# Patient Record
Sex: Female | Born: 1995 | Hispanic: No | Marital: Single | Smoking: Former smoker
Health system: Southern US, Community
[De-identification: ages and names within clinical notes are randomized; demographics above are authoritative.]

## PROBLEM LIST (undated history)

## (undated) DIAGNOSIS — F32A Depression, unspecified: Secondary | ICD-10-CM

## (undated) DIAGNOSIS — F319 Bipolar disorder, unspecified: Secondary | ICD-10-CM

## (undated) DIAGNOSIS — Z8619 Personal history of other infectious and parasitic diseases: Secondary | ICD-10-CM

## (undated) DIAGNOSIS — F419 Anxiety disorder, unspecified: Secondary | ICD-10-CM

## (undated) DIAGNOSIS — R87629 Unspecified abnormal cytological findings in specimens from vagina: Secondary | ICD-10-CM

## (undated) HISTORY — DX: Depression, unspecified: F32.A

## (undated) HISTORY — DX: Bipolar disorder, unspecified: F31.9

## (undated) HISTORY — PX: NO PAST SURGERIES: SHX2092

## (undated) HISTORY — DX: Unspecified abnormal cytological findings in specimens from vagina: R87.629

## (undated) HISTORY — DX: Anxiety disorder, unspecified: F41.9

---

## 2015-01-25 DIAGNOSIS — N883 Incompetence of cervix uteri: Secondary | ICD-10-CM

## 2020-07-17 NOTE — L&D Delivery Note (Signed)
OB/GYN Faculty Practice Delivery Note  Elizabeth [name] is a 25 y.o. [id number] s/p SVD at [redacted]w[redacted]d. She was admitted for SROM and early labor.   ROM: 5h 57m with clear fluid GBS Status: Negative/-- (07/12 1109) Maximum Maternal Temperature: 98.4  Labor Progress: Initial SVE: 3.5/50. She then progressed to complete without augmentation.   Delivery Date/Time: 02/16/2021, 4709  Delivery: Called to room and patient was complete and pushing. Head delivered middle OA. No nuchal cord present. Shoulder and body delivered in usual fashion. Infant with spontaneous cry, placed on mother's abdomen, dried and stimulated. Cord clamped x 2 after 1-minute delay, and cut by father. Cord blood drawn. Placenta delivered spontaneously with gentle cord traction. Fundus firm with massage and Pitocin. Labia, perineum, vagina, and cervix inspected inspected with a peri-urethral and 1 st degree perineal, repaired with 4.0 and 3.0 vicryl respectively.   Baby Weight: pending  Placenta: Sent to L&D Complications: None Lacerations: right periurethral and 1st degree  EBL: 28 mL Analgesia: Epidural  Infant:  APGAR (1 MIN):  9 APGAR (5 MINS):  9 APGAR (10 MINS):     [name], DO  OB Family Medicine Fellow, [facility] for [facility], [facility] 02/16/2021, 9:43 AM

## 2020-07-26 LAB — OB RESULTS CONSOLE HGB/HCT, BLOOD
HCT: 42 — AB (ref 29–41)
Hemoglobin: 14

## 2020-07-26 LAB — HIV ANTIBODY (ROUTINE TESTING W REFLEX): HIV Screen 4th Generation wRfx: NONREACTIVE

## 2020-07-26 LAB — HEPATITIS C ANTIBODY
HCV Ab: NEGATIVE
HCV Ab: NEGATIVE

## 2020-07-26 LAB — OB RESULTS CONSOLE RPR: RPR: NONREACTIVE

## 2020-07-26 LAB — OB RESULTS CONSOLE GC/CHLAMYDIA
Chlamydia: NEGATIVE
Gonorrhea: NEGATIVE

## 2020-07-26 LAB — OB RESULTS CONSOLE HIV ANTIBODY (ROUTINE TESTING): HIV: NONREACTIVE

## 2020-07-26 LAB — OB RESULTS CONSOLE ABO/RH: RH Type: POSITIVE

## 2020-07-26 LAB — OB RESULTS CONSOLE HEPATITIS B SURFACE ANTIGEN: Hepatitis B Surface Ag: NEGATIVE

## 2020-07-26 LAB — OB RESULTS CONSOLE RUBELLA ANTIBODY, IGM: Rubella: IMMUNE

## 2020-07-26 LAB — OB RESULTS CONSOLE PLATELET COUNT: Platelets: 221

## 2020-10-05 ENCOUNTER — Encounter (HOSPITAL_COMMUNITY): Payer: Self-pay | Admitting: Obstetrics & Gynecology

## 2020-10-05 ENCOUNTER — Inpatient Hospital Stay (HOSPITAL_COMMUNITY)
Admission: AD | Admit: 2020-10-05 | Discharge: 2020-10-05 | Disposition: A | Discharge status: Home / Self Care | Payer: Self-pay | Attending: Obstetrics & Gynecology | Admitting: Obstetrics & Gynecology

## 2020-10-05 ENCOUNTER — Other Ambulatory Visit: Payer: Self-pay

## 2020-10-05 DIAGNOSIS — B9689 Other specified bacterial agents as the cause of diseases classified elsewhere: Secondary | ICD-10-CM | POA: Insufficient documentation

## 2020-10-05 DIAGNOSIS — O26899 Other specified pregnancy related conditions, unspecified trimester: Secondary | ICD-10-CM

## 2020-10-05 DIAGNOSIS — R102 Pelvic and perineal pain: Secondary | ICD-10-CM | POA: Insufficient documentation

## 2020-10-05 DIAGNOSIS — R1030 Lower abdominal pain, unspecified: Secondary | ICD-10-CM | POA: Insufficient documentation

## 2020-10-05 DIAGNOSIS — Z3A20 20 weeks gestation of pregnancy: Secondary | ICD-10-CM | POA: Insufficient documentation

## 2020-10-05 DIAGNOSIS — O23592 Infection of other part of genital tract in pregnancy, second trimester: Secondary | ICD-10-CM | POA: Insufficient documentation

## 2020-10-05 DIAGNOSIS — O99332 Smoking (tobacco) complicating pregnancy, second trimester: Secondary | ICD-10-CM | POA: Insufficient documentation

## 2020-10-05 DIAGNOSIS — N760 Acute vaginitis: Secondary | ICD-10-CM

## 2020-10-05 DIAGNOSIS — Z87410 Personal history of cervical dysplasia: Secondary | ICD-10-CM | POA: Insufficient documentation

## 2020-10-05 DIAGNOSIS — O26892 Other specified pregnancy related conditions, second trimester: Secondary | ICD-10-CM | POA: Insufficient documentation

## 2020-10-05 DIAGNOSIS — Z79890 Hormone replacement therapy (postmenopausal): Secondary | ICD-10-CM | POA: Insufficient documentation

## 2020-10-05 DIAGNOSIS — F17210 Nicotine dependence, cigarettes, uncomplicated: Secondary | ICD-10-CM | POA: Insufficient documentation

## 2020-10-05 LAB — URINALYSIS, ROUTINE W REFLEX MICROSCOPIC
Bilirubin Urine: NEGATIVE
Glucose, UA: NEGATIVE mg/dL
Hgb urine dipstick: NEGATIVE
Ketones, ur: NEGATIVE mg/dL
Leukocytes,Ua: NEGATIVE
Nitrite: NEGATIVE
Protein, ur: NEGATIVE mg/dL
Specific Gravity, Urine: 1.013 (ref 1.005–1.030)
pH: 9 — ABNORMAL HIGH (ref 5.0–8.0)

## 2020-10-05 LAB — WET PREP, GENITAL
Sperm: NONE SEEN
Trich, Wet Prep: NONE SEEN
Yeast Wet Prep HPF POC: NONE SEEN

## 2020-10-05 MED ORDER — METRONIDAZOLE 500 MG PO TABS: 500.0000 mg | ORAL_TABLET | Freq: Two times a day (BID) | ORAL | 0 refills | Status: DC

## 2020-10-05 NOTE — MAU Provider Note (Signed)
History     CSN: [id number]  Arrival date and time: 10/05/20 1815   Event Date/Time   First Provider Initiated Contact with Patient 10/05/20 1959      Chief Complaint  Patient presents with  . Vaginal Discharge  . Vaginal Pain   Elizabeth [name] is a 25 y.o. [id number] at [redacted]w[redacted]d by early [place] who receives care in [place].  She presents today for Vaginal Discharge and Vaginal Pain.  She states she has been having "a lot of discharge that is watery and clear."  She states the discharge has been ongoing for about 3 weeks.  She reports she was initially on progesterone and was told to discontinue usage to monitor the discharge. However, since discontinuing the discharge has continued. She denies itching or odor.  She endorses a history of CT and GC, but denies infection during pregnancy.   She also reports "pain and pressure" in the vaginal area.  She states the pain is intermittent and is "a throbbing."  She states it ranges from 8-9/10 and can the "be off."  She states the pain has been present for about one week.  She states the pain started after driving up from [place].    She reports that she "high risk" and was told to come to the ED until she can get her [place] insurance. She denies perception of fetal movement yet. 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  She denies issues during this pregnancy and states she was placed on progesterone for "preventative reasons."    OB History    Gravida  6   Para  1   Term  1   Preterm      AB  4   Living  1     SAB  3   IAB  1   Ectopic      Multiple      Live Births              No past medical history on file.  History reviewed. No pertinent surgical history.  History reviewed. No pertinent family history.  Social History   Tobacco Use  . Smoking status: Current Some Day Smoker    Packs/day: 0.25  . Smokeless tobacco: Never Used  Substance Use Topics   . Alcohol use: Not Currently  . Drug use: Not Currently    Allergies: Not on File  Medications Prior to Admission  Medication Sig Dispense Refill Last Dose  . progesterone 200 MG SUPP Place 200 mg vaginally at bedtime.   09/05/2020 at Unknown time    Review of Systems  Constitutional: Negative for chills and fever.  Respiratory: Negative for cough and shortness of breath.   Gastrointestinal: Positive for abdominal pain. Negative for constipation, diarrhea, nausea and vomiting.  Genitourinary: Positive for vaginal discharge. Negative for difficulty urinating, dysuria and vaginal bleeding.  Musculoskeletal: Positive for back pain (Intermittent-None currently, usually after work prior to laying down.).  Neurological: Negative for dizziness, light-headedness and headaches.   Physical Exam   Blood pressure (!) 106/51, pulse 71, temperature 98.3 F (36.8 C), temperature source Oral, resp. rate 16, height 5' 3.5" (1.613 m), weight 52.1 kg, SpO2 100 %.  Physical Exam Constitutional:      Appearance: Normal appearance.  HENT:     Head: Normocephalic and atraumatic.  Eyes:     Conjunctiva/sclera: Conjunctivae normal.  Cardiovascular:     Rate and Rhythm: Normal rate and regular rhythm.  Heart sounds: Normal heart sounds.  Pulmonary:     Effort: Pulmonary effort is normal. No respiratory distress.     Breath sounds: Normal breath sounds.  Abdominal:     General: Bowel sounds are normal.     Tenderness: There is no abdominal tenderness.     Comments: Fundus at umbilicus  Genitourinary:    Comments: Speculum Exam: -Normal External Genitalia: Non tender, no apparent discharge at introitus.  -Vaginal Vault: Pink mucosa with good rugae. Small amt thin white frothy discharge -wet prep collected -Cervix:Pink, no lesions, cysts, or polyps.  Appears closed. No active bleeding from os-GC/CT collected -Bimanual Exam: Dilation: Closed Effacement (%): 50 Exam by:: Sabas Sous,  CNM  Musculoskeletal:        General: Normal range of motion.     Cervical back: Normal range of motion.  Skin:    General: Skin is warm and dry.  Neurological:     Mental Status: She is alert.  Psychiatric:        Mood and Affect: Mood normal.        Behavior: Behavior normal.        Thought Content: Thought content normal.     MAU Course  Procedures Results for orders placed or performed during the hospital encounter of 10/05/20 (from the past 24 hour(s))  Urinalysis, Routine w reflex microscopic Urine, Clean Catch     Status: Abnormal   Collection Time: 10/05/20  7:00 PM  Result Value Ref Range   Color, Urine YELLOW YELLOW   APPearance HAZY (A) CLEAR   Specific Gravity, Urine 1.013 1.005 - 1.030   pH 9.0 (H) 5.0 - 8.0   Glucose, UA NEGATIVE NEGATIVE mg/dL   Hgb urine dipstick NEGATIVE NEGATIVE   Bilirubin Urine NEGATIVE NEGATIVE   Ketones, ur NEGATIVE NEGATIVE mg/dL   Protein, ur NEGATIVE NEGATIVE mg/dL   Nitrite NEGATIVE NEGATIVE   Leukocytes,Ua NEGATIVE NEGATIVE  Wet prep, genital     Status: Abnormal   Collection Time: 10/05/20  8:08 PM  Result Value Ref Range   Yeast Wet Prep HPF POC NONE SEEN NONE SEEN   Trich, Wet Prep NONE SEEN NONE SEEN   Clue Cells Wet Prep HPF POC PRESENT (A) NONE SEEN   WBC, Wet Prep HPF POC MANY (A) NONE SEEN   Sperm NONE SEEN     MDM Physical Exam Labs: Wet Prep, GC/CT Assessment and Plan  25 year old, [id number]  SIUP at 20.1 weeks Vaginal Discharge  -Reviewed POC with patient. -Exam performed and findings discussed.  -Cultures collected and pending.  -Discussed starting care at [facility] provider in [facility] -Informational sheet given. -Patient states she hopes/plans to start care in [facility]. -Encouraged to buy and utilize a maternity belt/band. -Will await results.   Cherre Robins 10/05/2020, 7:59 PM   Reassessment (9:12 PM)  -Results return significant for clue cells. -Provider to bedside and patient educated on findings  and treatment. -Patient given paper script for Metronidazole and encouraged to use good rx to try to obtain script. -Informed that GC/CT pending and if positive hospital will contact her for information on treatment. -Encouraged to call or return to MAU if symptoms worsen or with the onset of new symptoms. -Discharged to home in stable condition.  Cherre Robins MSN, CNM Advanced Practice Provider, Center for [employer]

## 2020-10-05 NOTE — Discharge Instructions (Signed)
  [place] [facility] Providers          [facility]  [address], [place], [place] [address]  [phone]  [facility]  [address], [place], [place] [address]  [phone]  [facility]  [address], [place], [place] [address]  [phone]  [facility] [place] [facility]  [address], [place], [place] [address]  [phone]  [facility]  [address]First floor), [place], [place] [address]  [phone]  [facility] [address], [place], [place] [address] [phone]  [facility]  [address], [place], [place] [address]  [phone]  [facility]  [address], [place], [place] [address]  [phone]  [facility]  [address], [place], [place] [address]  [phone]  [facility]  [address], [place], [place] [address]  [phone]  [facility]  [address], [place], [place] [address]  [phone]  [facility]  [address], [place], [place] [address]  [phone]  [facility]   [address], [place], [place] [address]  [phone]  [facility]  [address], [place], [place] [address]   [phone]  [facility]  [address], ,  [address]  [phone]         

## 2020-10-05 NOTE — MAU Note (Signed)
Is high risk, coming from [place]. Her dr in [place] told her to go to the emergency rm: she has been "discharging a lot and having pain and pressure in vaginal area".  Last seen end of Feb. Hx of SAB x3, short cx.  Is on progesterone.

## 2020-10-06 LAB — GC/CHLAMYDIA PROBE AMP (~~LOC~~) NOT AT ARMC
Chlamydia: NEGATIVE
Comment: NEGATIVE
Comment: NORMAL
Neisseria Gonorrhea: NEGATIVE

## 2020-11-13 ENCOUNTER — Other Ambulatory Visit: Payer: Self-pay

## 2020-11-13 ENCOUNTER — Encounter (HOSPITAL_BASED_OUTPATIENT_CLINIC_OR_DEPARTMENT_OTHER): Payer: Self-pay | Admitting: Emergency Medicine

## 2020-11-13 ENCOUNTER — Emergency Department (HOSPITAL_BASED_OUTPATIENT_CLINIC_OR_DEPARTMENT_OTHER)
Admission: EM | Admit: 2020-11-13 | Discharge: 2020-11-13 | Disposition: A | Discharge status: Home / Self Care | Payer: Medicaid Other | Attending: Emergency Medicine | Admitting: Emergency Medicine

## 2020-11-13 DIAGNOSIS — O99332 Smoking (tobacco) complicating pregnancy, second trimester: Secondary | ICD-10-CM | POA: Diagnosis not present

## 2020-11-13 DIAGNOSIS — O26852 Spotting complicating pregnancy, second trimester: Secondary | ICD-10-CM | POA: Insufficient documentation

## 2020-11-13 DIAGNOSIS — Z3A26 26 weeks gestation of pregnancy: Secondary | ICD-10-CM | POA: Diagnosis not present

## 2020-11-13 DIAGNOSIS — F17210 Nicotine dependence, cigarettes, uncomplicated: Secondary | ICD-10-CM | POA: Insufficient documentation

## 2020-11-13 DIAGNOSIS — R109 Unspecified abdominal pain: Secondary | ICD-10-CM

## 2020-11-13 DIAGNOSIS — R1084 Generalized abdominal pain: Secondary | ICD-10-CM | POA: Diagnosis not present

## 2020-11-13 NOTE — ED Notes (Signed)
Provider at the bedside.  

## 2020-11-13 NOTE — ED Triage Notes (Signed)
Pt reports she has generalized abdominal pain and pelvic pressure today. States she noticed she was spotting some this morning when she woke up. Pt is [redacted] weeks pregnant. 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

## 2020-11-13 NOTE — Discharge Instructions (Addendum)
[facility] [address] [place],  [place]  [address]  Phone number: [phone]  

## 2020-11-13 NOTE — Progress Notes (Signed)
Received call from [facility] RN. Pt is a G5P1 at [redacted] weeks gestation presenting with abd pain and pelvic pressure. She also reports spotting. No active bleeding or leaking of fluid. Pt has had three miscarriages and delivered her first baby at 36 weeks. She reports having taken progesterone during her pregnancy. She has recently moved here from [place].

## 2020-11-13 NOTE — ED Notes (Signed)
Return call received from rapid OB.  Reports Dr Eugenie Filler reviewed monitor strips and didn't see any issues with the baby or contractions but would be glad to monitor more closely at maternity admissions if needed.  Dr Elroy Channel can be reached at [phone].

## 2020-11-13 NOTE — ED Provider Notes (Signed)
[facility] EMERGENCY DEPARTMENT Provider Note   CSN: [id number] Arrival date & time: 11/13/20  1347     History Chief Complaint  Patient presents with  . Abdominal Pain    26 wk preg    Elizabeth [name] is a 25 y.o. female.   Abdominal Pain Pain location:  Generalized Pain quality: cramping   Pain radiates to:  Does not radiate Pain severity:  Moderate Onset quality:  Gradual Duration:  1 day Timing:  Intermittent Progression:  Waxing and waning Chronicity:  New Context comment:  26 w preg Relieved by:  Nothing Worsened by:  Nothing Ineffective treatments:  None tried Associated symptoms: no chest pain, no chills, no constipation, no cough, no diarrhea, no dysuria, no fever, no nausea, no shortness of breath and no vomiting   Risk factors: pregnancy        History reviewed. No pertinent past medical history.  There are no problems to display for this patient.   History reviewed. No pertinent surgical history.   OB History    Gravida  6   Para  1   Term  1   Preterm      AB  4   Living  1     SAB  3   IAB  1   Ectopic      Multiple      Live Births              No family history on file.  Social History   Tobacco Use  . Smoking status: Current Some Day Smoker    Packs/day: 0.25  . Smokeless tobacco: Never Used  Substance Use Topics  . Alcohol use: Not Currently  . Drug use: Not Currently    Home Medications Prior to Admission medications   Medication Sig Start Date End Date Taking? Authorizing Provider  metroNIDAZOLE (FLAGYL) 500 MG tablet Take 1 tablet (500 mg total) by mouth 2 (two) times daily. 10/05/20   Gerrit Heck, CNM  progesterone 200 MG SUPP Place 200 mg vaginally at bedtime.    [provider]    Allergies    Patient has no known allergies.  Review of Systems   Review of Systems  Constitutional: Negative for chills and fever.  HENT: Negative for congestion and rhinorrhea.   Respiratory:  Negative for cough and shortness of breath.   Cardiovascular: Negative for chest pain and palpitations.  Gastrointestinal: Positive for abdominal pain. Negative for constipation, diarrhea, nausea and vomiting.  Genitourinary: Negative for difficulty urinating and dysuria.  Musculoskeletal: Negative for arthralgias and back pain.  Skin: Negative for rash and wound.  Neurological: Negative for light-headedness and headaches.    Physical Exam Updated Vital Signs BP 107/62   Pulse 72   Temp 99 F (37.2 C) (Oral)   Resp 20   Ht 5' 3.5" (1.613 m)   Wt 49 kg   SpO2 100%   BMI 18.83 kg/m   Physical Exam Vitals and nursing note reviewed. Exam conducted with a chaperone present.  Constitutional:      General: She is not in acute distress.    Appearance: Normal appearance.  HENT:     Head: Normocephalic and atraumatic.     Nose: No rhinorrhea.  Eyes:     General:        Right eye: No discharge.        Left eye: No discharge.     Conjunctiva/sclera: Conjunctivae normal.  Cardiovascular:     Rate and  Rhythm: Normal rate and regular rhythm.  Pulmonary:     Effort: Pulmonary effort is normal. No respiratory distress.     Breath sounds: No stridor.  Abdominal:     General: Abdomen is flat. There is no distension.     Palpations: Abdomen is soft.     Tenderness: There is no abdominal tenderness. There is no guarding or rebound.     Comments: Gravid uterus, fundus just above the umbilicus  Musculoskeletal:        General: No tenderness or signs of injury.  Skin:    General: Skin is warm and dry.  Neurological:     General: No focal deficit present.     Mental Status: She is alert. Mental status is at baseline.     Motor: No weakness.  Psychiatric:        Mood and Affect: Mood normal.        Behavior: Behavior normal.     ED Results / Procedures / Treatments   Labs (all labs ordered are listed, but only abnormal results are displayed) Labs Reviewed - No data to  display  EKG None  Radiology No results found.  Procedures Procedures   Medications Ordered in ED Medications - No data to display  ED Course  I have reviewed the triage vital signs and the nursing notes.  Pertinent labs & imaging results that were available during my care of the patient were reviewed by me and considered in my medical decision making (see chart for details).    MDM Rules/Calculators/A&P                          [redacted] weeks pregnant.  Good prenatal care thus far.  History of 3 spontaneous abortions, secondary to cervical insufficiency.  She is worried that could be happening.  She was having pain and some light spotting.  She has been here with fetal heart rate with a good range of the 140s.  No contractions on tocometry.  OB/GYN is reviewed the strips and agrees.  Things do not look like any signs of precipitous delivery or concerns.  Family is comfortable with outpatient follow-up.  No further work-up is needed as she has no tenderness no complaints at this time.  She is given return precautions outpatient follow-up discussed Final Clinical Impression(s) / ED Diagnoses Final diagnoses:  Abdominal pain, unspecified abdominal location    Rx / DC Orders ED Discharge Orders    None       Sabino Donovan, MD 11/13/20 [phone]

## 2020-11-13 NOTE — Progress Notes (Signed)
Pt is 25 5/[redacted] weeks pregnant and is a G6P1.

## 2020-11-13 NOTE — Progress Notes (Signed)
Spoke with Dr. Alysia Penna. Pt is a G6P1 at 25 5/[redacted] weeks gestation complaining of abd pain and spotting last night. Her RN reports that the pt is not spotting now. Pt took progesterone with her first pregnancy and had a vaginal delivery at 36 weeks. She has a hx of miscarriages. Dr. Alysia Penna reviewed the [facility] tracing and says the baby looks fine for it's gestational age, there are no uc's tracing. They can send the pt here if they would like [place] to evaluate her further.Dr. Alysia Penna can be reached at [phone]. Illene Bolus RN notified.

## 2020-11-13 NOTE — ED Notes (Signed)
Discharge reviewed.  Verbalized understanding.

## 2020-11-13 NOTE — ED Notes (Signed)
Spoke with OB rapid response nurse.  Reports everything looks okay so far on the fetal monitor for the gestational age.  To call back once she has spoken to the physician.

## 2020-11-13 NOTE — Progress Notes (Signed)
EFM dc'd by [facility] staff. Pt dc'd home.

## 2020-11-13 NOTE — Progress Notes (Signed)
Dr. Alysia Penna in surgery. Left message with the circulating RN that I needed to speak to him about a pt at [facility].

## 2020-11-14 DIAGNOSIS — Z419 Encounter for procedure for purposes other than remedying health state, unspecified: Secondary | ICD-10-CM | POA: Diagnosis not present

## 2020-11-16 ENCOUNTER — Encounter: Payer: Self-pay | Admitting: Advanced Practice Midwife

## 2020-11-16 ENCOUNTER — Ambulatory Visit (INDEPENDENT_AMBULATORY_CARE_PROVIDER_SITE_OTHER): Payer: Medicaid Other | Admitting: Advanced Practice Midwife

## 2020-11-16 ENCOUNTER — Other Ambulatory Visit (HOSPITAL_BASED_OUTPATIENT_CLINIC_OR_DEPARTMENT_OTHER): Payer: Self-pay

## 2020-11-16 ENCOUNTER — Other Ambulatory Visit: Payer: Self-pay

## 2020-11-16 VITALS — BP 104/54 | HR 76 | Wt 122.0 lb

## 2020-11-16 DIAGNOSIS — B009 Herpesviral infection, unspecified: Secondary | ICD-10-CM

## 2020-11-16 DIAGNOSIS — Z3480 Encounter for supervision of other normal pregnancy, unspecified trimester: Secondary | ICD-10-CM | POA: Insufficient documentation

## 2020-11-16 DIAGNOSIS — O0990 Supervision of high risk pregnancy, unspecified, unspecified trimester: Secondary | ICD-10-CM

## 2020-11-16 DIAGNOSIS — O0992 Supervision of high risk pregnancy, unspecified, second trimester: Secondary | ICD-10-CM

## 2020-11-16 DIAGNOSIS — Z8619 Personal history of other infectious and parasitic diseases: Secondary | ICD-10-CM

## 2020-11-16 DIAGNOSIS — O09299 Supervision of pregnancy with other poor reproductive or obstetric history, unspecified trimester: Secondary | ICD-10-CM | POA: Insufficient documentation

## 2020-11-16 DIAGNOSIS — Z8751 Personal history of pre-term labor: Secondary | ICD-10-CM

## 2020-11-16 DIAGNOSIS — O09292 Supervision of pregnancy with other poor reproductive or obstetric history, second trimester: Secondary | ICD-10-CM

## 2020-11-16 DIAGNOSIS — Z3A26 26 weeks gestation of pregnancy: Secondary | ICD-10-CM

## 2020-11-16 MED ORDER — METRONIDAZOLE 500 MG PO TABS
500.0000 mg | ORAL_TABLET | Freq: Two times a day (BID) | ORAL | 0 refills | Status: DC
  Filled 2020-11-16: qty 14, 7d supply, fill #0

## 2020-11-16 MED ORDER — METRONIDAZOLE 500 MG PO TABS: 500.0000 mg | ORAL_TABLET | Freq: Two times a day (BID) | ORAL | 0 refills | Status: DC

## 2020-11-16 NOTE — Progress Notes (Signed)
INITIAL OBSTETRICAL VISIT Patient name: Elizabeth [name] MRN [id number]  Date of birth: [birth year] Chief Complaint:   Initial Prenatal Visit  History of Present Illness:   Elizabeth [name] is a 25 y.o. [phone] African American female at [redacted]w[redacted]d by [place] at first trimester weeks with an Estimated Date of Delivery: 02/21/21 being seen today for her initial obstetrical visit.   Her obstetrical history is significant for history of incompetent cervix.  Treated with progesterone and delivered at 36 weeks.   Had progesterone this pregnancy (5 pills left)  Today she reports no complaints.  Depression screen PHQ 2/9 11/16/2020  Decreased Interest 1  Down, Depressed, Hopeless 1  PHQ - 2 Score 2  Altered sleeping 2  Tired, decreased energy 2  Change in appetite 1  Feeling bad or failure about yourself  0  Trouble concentrating 0  Moving slowly or fidgety/restless 0  Suicidal thoughts 0  PHQ-9 Score 7    No LMP recorded. Patient is pregnant. Last pap 2022. Results were: normal Review of Systems:   Pertinent items are noted in HPI Denies cramping/contractions, leakage of fluid, vaginal bleeding, abnormal vaginal discharge w/ itching/odor/irritation, headaches, visual changes, shortness of breath, chest pain, abdominal pain, severe nausea/vomiting, or problems with urination or bowel movements unless otherwise stated above.  Pertinent History Reviewed:  Reviewed past medical,surgical, social, obstetrical and family history.  Reviewed problem list, medications and allergies. OB History  Gravida Para Term Preterm AB Living  6 1 0 1 4 1   SAB IAB Ectopic Multiple Live Births  3 1     1     # Outcome Date GA Lbr Len/2nd Weight Sex Delivery Anes PTL Lv  6 Current           5 Preterm 01/25/15 [redacted]w[redacted]d  6 lb 1 oz (2.75 kg) F         Birth Comments: Cx change started at 19 wks     Complications: Incompetent cervix  4 IAB           3 SAB  [redacted]w[redacted]d            Birth Comments: Never went to doctor  2 SAB  [redacted]w[redacted]d             Birth Comments: never went to doctor  1 SAB  [redacted]w[redacted]d            Birth Comments: never went to doctor   Physical Assessment:   Vitals:   11/16/20 0900  BP: (!) 104/54  Pulse: 76  Weight: 122 lb (55.3 kg)  Body mass index is 21.27 kg/m.       Physical Examination:  General appearance - well appearing, and in no distress  Mental status - alert, oriented to person, place, and time  Psych:  She has a normal mood and affect  Skin - warm and dry, normal color, no suspicious lesions noted  Chest - effort normal, all lung fields clear to auscultation bilaterally  Heart - normal rate and regular rhythm  Abdomen - soft, nontender  Extremities:  No swelling or varicosities noted  Pelvic - Declines pelvic exam.  States was seen in April and checked out.   No results found for this or any previous visit (from the past 24 hour(s)).  Assessment & Plan:  1) High-Risk Pregnancy 01/16/21 at [redacted]w[redacted]d with an Estimated Date of Delivery: 02/21/21   2) Initial OB visit  3) History of Incompetent Cervix.  Will have them measure when she gets her anatomy [redacted]w[redacted]d  if necessary..  Denies contractions   Initial labs obtained Continue prenatal vitamins Reviewed n/v relief measures and warning s/s to report Reviewed recommended weight gain based on pre-gravid BMI Encouraged well-balanced diet Genetic & carrier screening discussed:Normal at other practice Ultrasound discussed; fetal survey: ordered  Last visit in [place] was at 15 weeks CCNC completed> form faxed if has or is planning to apply for medicaid The nature of [place] [facility] [employer] with multiple MDs and other Advanced Practice Providers was explained to patient; also emphasized that fellows, residents, and students are part of our team.   Indications for ASA therapy (per uptodate) One of the following: H/O preeclampsia, especially early onset/adverse outcome No Multifetal gestation No CHTN No T1DM or T2DM No Chronic kidney  disease No Autoimmune disease (antiphospholipid syndrome, systemic lupus erythematosus) No  OR Two or more of the following: Nulliparity No Obesity (BMI>30 kg/m2) No Family h/o preeclampsia in mother or sister No Age ?35 years No Sociodemographic characteristics (African American race, low socioeconomic level) Yes Personal risk factors (eg, previous pregnancy w/ LBW or SGA, previous adverse pregnancy outcome [eg, stillbirth], interval >10 years between pregnancies) No  Indications for early A1C (per uptodate) BMI >=25 (>=23 in Asian women) AND one of the following GDM in a previous pregnancy No Previous A1C?5.7, impaired glucose tolerance, or impaired fasting glucose on previous testing No First-degree relative with diabetes No High-risk race/ethnicity (eg, African American, Latino, Native American, Asian American, Pacific Islander) Yes History of cardiovascular disease No HTN or on therapy for hypertension No HDL cholesterol level <35 mg/dL (0.73 mmol/L) and/or a triglyceride level >250 mg/dL (7.10 mmol/L) No PCOS No Physical inactivity No Other clinical condition associated with insulin resistance (eg, severe obesity, acanthosis nigricans) No Previous birth of an infant weighing ?4000 g No Previous stillbirth of unknown cause No >= 40yo No  Follow-up: No follow-ups on file.   Orders Placed This Encounter  Procedures  . [place] MFM OB DETAIL +14 WK  . OB RESULTS CONSOLE RPR  . OB RESULTS CONSOLE HIV antibody  . OB RESULTS CONSOLE Rubella Antibody  . OB RESULTS CONSOLE Hepatitis B surface antigen  . OB RESULTS CONSOLE Hemoglobin and hematocrit, blood  . OB RESULTS CONSOLE PLATELET COUNT  . HIV Antibody (routine testing w rflx)  . Hepatitis C antibody  . Hepatitis C antibody  . OB RESULTS CONSOLE GC/Chlamydia  . OB RESULTS CONSOLE ABO/Rh    Eleonore Shippee CNM, [facility] 11/16/2020 10:57 AM

## 2020-11-16 NOTE — Progress Notes (Signed)
Patient has hx of herpes. Not on suppression meds - only takes when she has outbreak. Armandina Stammer RN

## 2020-11-16 NOTE — Telephone Encounter (Signed)
Patient states she was never able to fill script for flagyl. Sent rx to our outpatient pharmacy. Armandina Stammer RN

## 2020-11-23 ENCOUNTER — Telehealth: Payer: Self-pay

## 2020-11-23 ENCOUNTER — Other Ambulatory Visit (HOSPITAL_BASED_OUTPATIENT_CLINIC_OR_DEPARTMENT_OTHER): Payer: Self-pay

## 2020-11-23 DIAGNOSIS — B379 Candidiasis, unspecified: Secondary | ICD-10-CM

## 2020-11-23 MED ORDER — TERCONAZOLE 0.4 % VA CREA
TOPICAL_CREAM | VAGINAL | 0 refills | Status: DC
  Filled 2020-11-23: qty 45, 30d supply, fill #0

## 2020-11-23 NOTE — Telephone Encounter (Signed)
Pt states she just finished taking Flagyl for BV and now she is having some vaginal itching, irritation, and discharge. Terconazole 1 applicator intravaginally QHS x 3 days was sent to her pharmacy.  Kullen Tomasetti l Chiffon Kittleson, CMA

## 2020-11-30 ENCOUNTER — Other Ambulatory Visit (HOSPITAL_COMMUNITY)
Admission: RE | Admit: 2020-11-30 | Discharge: 2020-11-30 | Disposition: A | Discharge status: Home / Self Care | Payer: Medicaid Other | Source: Ambulatory Visit | Attending: Obstetrics and Gynecology | Admitting: Obstetrics and Gynecology

## 2020-11-30 ENCOUNTER — Other Ambulatory Visit: Payer: Self-pay

## 2020-11-30 ENCOUNTER — Ambulatory Visit (INDEPENDENT_AMBULATORY_CARE_PROVIDER_SITE_OTHER): Payer: Medicaid Other | Admitting: Obstetrics and Gynecology

## 2020-11-30 VITALS — BP 102/57 | HR 78 | Wt 123.0 lb

## 2020-11-30 DIAGNOSIS — Z3A28 28 weeks gestation of pregnancy: Secondary | ICD-10-CM | POA: Diagnosis not present

## 2020-11-30 DIAGNOSIS — Z3480 Encounter for supervision of other normal pregnancy, unspecified trimester: Secondary | ICD-10-CM

## 2020-11-30 DIAGNOSIS — Z3483 Encounter for supervision of other normal pregnancy, third trimester: Secondary | ICD-10-CM

## 2020-11-30 DIAGNOSIS — Z23 Encounter for immunization: Secondary | ICD-10-CM | POA: Diagnosis not present

## 2020-11-30 NOTE — Patient Instructions (Signed)
Tdap (Tetanus, Diphtheria, Pertussis) Vaccine: What You Need to Know 1. Why get vaccinated? Tdap vaccine can prevent tetanus, diphtheria, and pertussis. Diphtheria and pertussis spread from person to person. Tetanus enters the body through cuts or wounds.  TETANUS (T) causes painful stiffening of the muscles. Tetanus can lead to serious health problems, including being unable to open the mouth, having trouble swallowing and breathing, or death.  DIPHTHERIA (D) can lead to difficulty breathing, heart failure, paralysis, or death.  PERTUSSIS (aP), also known as "whooping cough," can cause uncontrollable, violent coughing that makes it hard to breathe, eat, or drink. Pertussis can be extremely serious especially in babies and young children, causing pneumonia, convulsions, brain damage, or death. In teens and adults, it can cause weight loss, loss of bladder control, passing out, and rib fractures from severe coughing. 2. Tdap vaccine Tdap is only for children 7 years and older, adolescents, and adults.  Adolescents should receive a single dose of Tdap, preferably at age 11 or 12 years. Pregnant people should get a dose of Tdap during every pregnancy, preferably during the early part of the third trimester, to help protect the newborn from pertussis. Infants are most at risk for severe, life-threatening complications from pertussis. Adults who have never received Tdap should get a dose of Tdap. Also, adults should receive a booster dose of either Tdap or Td (a different vaccine that protects against tetanus and diphtheria but not pertussis) every 10 years, or after 5 years in the case of a severe or dirty wound or burn. Tdap may be given at the same time as other vaccines. 3. Talk with your health care provider Tell your vaccine provider if the person getting the vaccine:  Has had an allergic reaction after a previous dose of any vaccine that protects against tetanus, diphtheria, or pertussis, or  has any severe, life-threatening allergies  Has had a coma, decreased level of consciousness, or prolonged seizures within 7 days after a previous dose of any pertussis vaccine (DTP, DTaP, or Tdap)  Has seizures or another nervous system problem  Has ever had Guillain-Barr Syndrome (also called "GBS")  Has had severe pain or swelling after a previous dose of any vaccine that protects against tetanus or diphtheria In some cases, your health care provider may decide to postpone Tdap vaccination until a future visit. People with minor illnesses, such as a cold, may be vaccinated. People who are moderately or severely ill should usually wait until they recover before getting Tdap vaccine.  Your health care provider can give you more information. 4. Risks of a vaccine reaction  Pain, redness, or swelling where the shot was given, mild fever, headache, feeling tired, and nausea, vomiting, diarrhea, or stomachache sometimes happen after Tdap vaccination. People sometimes faint after medical procedures, including vaccination. Tell your provider if you feel dizzy or have vision changes or ringing in the ears.  As with any medicine, there is a very remote chance of a vaccine causing a severe allergic reaction, other serious injury, or death. 5. What if there is a serious problem? An allergic reaction could occur after the vaccinated person leaves the clinic. If you see signs of a severe allergic reaction (hives, swelling of the face and throat, difficulty breathing, a fast heartbeat, dizziness, or weakness), call 9-1-1 and get the person to the nearest hospital. For other signs that concern you, call your health care provider.  Adverse reactions should be reported to the Vaccine Adverse Event Reporting System (VAERS). Your health   care provider will usually file this report, or you can do it yourself. Visit the VAERS website at www.vaers.hhs.gov or call 1-800-822-7967. VAERS is only for reporting  reactions, and VAERS staff members do not give medical advice. 6. The National Vaccine Injury Compensation Program The National Vaccine Injury Compensation Program (VICP) is a federal program that was created to compensate people who may have been injured by certain vaccines. Claims regarding alleged injury or death due to vaccination have a time limit for filing, which may be as short as two years. Visit the VICP website at www.hrsa.gov/vaccinecompensation or call 1-800-338-2382 to learn about the program and about filing a claim. 7. How can I learn more?  Ask your health care provider.  Call your local or state health department.  Visit the website of the Food and Drug Administration (FDA) for vaccine package inserts and additional information at www.fda.gov/vaccines-blood-biologics/vaccines.  Contact the Centers for Disease Control and Prevention (CDC): ? Call 1-800-232-4636 (1-800-CDC-INFO) or ? Visit CDC's website at www.cdc.gov/vaccines. Vaccine Information Statement Tdap (Tetanus, Diphtheria, Pertussis) Vaccine (02/20/2020) This information is not intended to replace advice given to you by your health care provider. Make sure you discuss any questions you have with your health care provider. Document Revised: 03/17/2020 Document Reviewed: 03/17/2020 Elsevier Patient Education  2021 Elsevier Inc.  

## 2020-11-30 NOTE — Progress Notes (Signed)
    PRENATAL VISIT NOTE  Subjective:  Elizabeth [name] is a 25 y.o. [phone] at [redacted]w[redacted]d being seen today for ongoing prenatal care.  She is currently monitored for the following issues for this high-risk pregnancy and has Supervision of other normal pregnancy, antepartum; H/O incompetent cervix, currently pregnant; and HSV infection on their problem list.  Patient reports no complaints.  Contractions: Not present. Vag. Bleeding: None.  Movement: (!) Decreased. Denies leaking of fluid.   The following portions of the patient's history were reviewed and updated as appropriate: allergies, current medications, past family history, past medical history, past social history, past surgical history and problem list.   Objective:   Vitals:   11/30/20 0855  BP: (!) 102/57  Pulse: 78  Weight: 123 lb (55.8 kg)    Fetal Status: Fetal Heart Rate (bpm): 128 Fundal Height: 28 cm Movement: (!) Decreased  Presentation: Undeterminable  General:  Alert, oriented and cooperative. Patient is in no acute distress.  Skin: Skin is warm and dry. No rash noted.   Cardiovascular: Normal heart rate noted  Respiratory: Normal respiratory effort, no problems with respiration noted  Abdomen: Soft, gravid, appropriate for gestational age.  Pain/Pressure: Present     Pelvic: Cervical exam performed in the presence of a chaperone Dilation: Closed Effacement (%): Thick Station: Ballotable,-3,external os 0.5 cm,  internal os closed.   Extremities: Normal range of motion.  Edema: None  Mental Status: Normal mood and affect. Normal behavior. Normal judgment and thought content.   Assessment and Plan:  Pregnancy: [id number] at [redacted]w[redacted]d 1. [redacted] weeks gestation of pregnancy  - CBC - Glucose Tolerance, 2 Hours w/1 Hour - HIV Antibody (routine testing w rflx) - RPR - Reviewed fetal kicks counts. Inconsistent movements is normal at 28 weeks. When you get home eat something and drink something cold, if you are ever concerned about  fetal movements, go to MAU at [facility] for evaluation. - TDAP given today. - Keep your [place] that is scheduled in 2 days - Preterm labor precautions.   Preterm labor symptoms and general obstetric precautions including but not limited to vaginal bleeding, contractions, leaking of fluid and fetal movement were reviewed in detail with the patient. Please refer to After Visit Summary for other counseling recommendations.   Return in about 2 weeks (around 12/14/2020), or MD visit d/t incomp. cervix.  Future Appointments  Date Time Provider Department Center  12/02/2020 12:30 PM [facility]URSE [facility]  12/02/2020 12:45 PM [facility] US6 [facility]    Venia Carbon, NP

## 2020-12-01 LAB — CBC
Hematocrit: 34.7 % (ref 34.0–46.6)
Hemoglobin: 11.4 g/dL (ref 11.1–15.9)
MCH: 30.2 pg (ref 26.6–33.0)
MCHC: 32.9 g/dL (ref 31.5–35.7)
MCV: 92 fL (ref 79–97)
Platelets: 154 10*3/uL (ref 150–450)
RBC: 3.77 x10E6/uL (ref 3.77–5.28)
RDW: 13.3 % (ref 11.7–15.4)
WBC: 8.6 10*3/uL (ref 3.4–10.8)

## 2020-12-01 LAB — HIV ANTIBODY (ROUTINE TESTING W REFLEX): HIV Screen 4th Generation wRfx: NONREACTIVE

## 2020-12-01 LAB — GLUCOSE TOLERANCE, 2 HOURS W/ 1HR
Glucose, 1 hour: 81 mg/dL (ref 65–179)
Glucose, 2 hour: 63 mg/dL — ABNORMAL LOW (ref 65–152)
Glucose, Fasting: 78 mg/dL (ref 65–91)

## 2020-12-01 LAB — CERVICOVAGINAL ANCILLARY ONLY
Bacterial Vaginitis (gardnerella): NEGATIVE
Candida Glabrata: NEGATIVE
Candida Vaginitis: NEGATIVE
Chlamydia: NEGATIVE
Comment: NEGATIVE
Comment: NEGATIVE
Comment: NEGATIVE
Comment: NEGATIVE
Comment: NEGATIVE
Comment: NORMAL
Neisseria Gonorrhea: NEGATIVE
Trichomonas: NEGATIVE

## 2020-12-01 LAB — RPR: RPR Ser Ql: NONREACTIVE

## 2020-12-02 ENCOUNTER — Ambulatory Visit: Payer: Medicaid Other | Admitting: *Deleted

## 2020-12-02 ENCOUNTER — Other Ambulatory Visit: Payer: Self-pay

## 2020-12-02 ENCOUNTER — Ambulatory Visit: Payer: Medicaid Other | Attending: Maternal & Fetal Medicine | Admitting: Maternal & Fetal Medicine

## 2020-12-02 ENCOUNTER — Ambulatory Visit: Payer: Medicaid Other | Attending: Advanced Practice Midwife

## 2020-12-02 DIAGNOSIS — O0993 Supervision of high risk pregnancy, unspecified, third trimester: Secondary | ICD-10-CM | POA: Insufficient documentation

## 2020-12-02 DIAGNOSIS — O0990 Supervision of high risk pregnancy, unspecified, unspecified trimester: Secondary | ICD-10-CM

## 2020-12-02 DIAGNOSIS — O09293 Supervision of pregnancy with other poor reproductive or obstetric history, third trimester: Secondary | ICD-10-CM | POA: Diagnosis not present

## 2020-12-02 DIAGNOSIS — Z3A28 28 weeks gestation of pregnancy: Secondary | ICD-10-CM | POA: Diagnosis not present

## 2020-12-02 DIAGNOSIS — O358XX0 Maternal care for other (suspected) fetal abnormality and damage, not applicable or unspecified: Secondary | ICD-10-CM

## 2020-12-02 DIAGNOSIS — Z8751 Personal history of pre-term labor: Secondary | ICD-10-CM | POA: Insufficient documentation

## 2020-12-02 DIAGNOSIS — Z3480 Encounter for supervision of other normal pregnancy, unspecified trimester: Secondary | ICD-10-CM | POA: Diagnosis not present

## 2020-12-02 DIAGNOSIS — O3433 Maternal care for cervical incompetence, third trimester: Secondary | ICD-10-CM

## 2020-12-02 DIAGNOSIS — O09299 Supervision of pregnancy with other poor reproductive or obstetric history, unspecified trimester: Secondary | ICD-10-CM

## 2020-12-02 DIAGNOSIS — Z8619 Personal history of other infectious and parasitic diseases: Secondary | ICD-10-CM | POA: Insufficient documentation

## 2020-12-02 DIAGNOSIS — O35EXX0 Maternal care for other (suspected) fetal abnormality and damage, fetal genitourinary anomalies, not applicable or unspecified: Secondary | ICD-10-CM

## 2020-12-02 NOTE — Progress Notes (Signed)
MFM brief consultation.  [name] is a G6P1 who ishere for a detailed exam given maternal history of cervical incompetence.  Single intrauterine pregnancy here for a detailed anatomy Normal anatomy with measurements consistent with dates There is good fetal movement and amniotic fluid volume  I discussed with [name]  today's finding of left unilateral renal pyelectasis with a measurement of 7.4 mm. I discussed the etiology of renal pyelectasis to include normal variant, ureteropelvic or vesicle junction obstruction and urethrovesicle reflux. Prior to 28 weeks the threshold for abnormal is <32mm but after 28 weeks >7 mm. The renal pelvis measures 7.4 mm today at a SFU grade 1 appearnace without renal caliectasis.  Lastly, [name] had a prior history of cervical incompetence given that she is in the third trimester we recommend clinical management at this time.   Follow up growth is scheduled in 4-6 weeks.   All questions answered.  I spent 15 mintues with > 50% in face to face consultation.  Novella Olive, MD.

## 2020-12-03 ENCOUNTER — Other Ambulatory Visit: Payer: Self-pay | Admitting: *Deleted

## 2020-12-03 DIAGNOSIS — O35EXX0 Maternal care for other (suspected) fetal abnormality and damage, fetal genitourinary anomalies, not applicable or unspecified: Secondary | ICD-10-CM

## 2020-12-03 DIAGNOSIS — O358XX0 Maternal care for other (suspected) fetal abnormality and damage, not applicable or unspecified: Secondary | ICD-10-CM

## 2020-12-14 ENCOUNTER — Other Ambulatory Visit: Payer: Self-pay

## 2020-12-14 ENCOUNTER — Encounter: Payer: Self-pay | Admitting: Advanced Practice Midwife

## 2020-12-14 ENCOUNTER — Other Ambulatory Visit (HOSPITAL_BASED_OUTPATIENT_CLINIC_OR_DEPARTMENT_OTHER): Payer: Self-pay

## 2020-12-14 ENCOUNTER — Ambulatory Visit (INDEPENDENT_AMBULATORY_CARE_PROVIDER_SITE_OTHER): Payer: Medicaid Other | Admitting: Advanced Practice Midwife

## 2020-12-14 VITALS — BP 96/57 | HR 79 | Wt 124.0 lb

## 2020-12-14 DIAGNOSIS — Z3A30 30 weeks gestation of pregnancy: Secondary | ICD-10-CM

## 2020-12-14 DIAGNOSIS — L739 Follicular disorder, unspecified: Secondary | ICD-10-CM

## 2020-12-14 MED ORDER — DOCUSATE SODIUM 100 MG PO CAPS
100.0000 mg | ORAL_CAPSULE | Freq: Two times a day (BID) | ORAL | 2 refills | Status: DC | PRN
  Filled 2020-12-14: qty 100, 50d supply, fill #0

## 2020-12-14 MED ORDER — PROCTOFOAM HC 1-1 % EX FOAM
1.0000 | Freq: Two times a day (BID) | CUTANEOUS | 3 refills | Status: DC
  Filled 2020-12-14: qty 10, 19d supply, fill #0

## 2020-12-14 NOTE — Progress Notes (Signed)
   PRENATAL VISIT NOTE  Subjective:  Elizabeth [name] is a 25 y.o. [phone] at [redacted]w[redacted]d being seen today for ongoing prenatal care.  She is currently monitored for the following issues for this high-risk pregnancy and has Supervision of other normal pregnancy, antepartum; H/O incompetent cervix, currently pregnant; and HSV infection on their problem list.  Patient reports has a "hair bump" and saw a little bleeding just now, not sure where from.  Contractions: Irritability. Vag. Bleeding: None.  Movement: Present. Denies leaking of fluid.   The following portions of the patient's history were reviewed and updated as appropriate: allergies, current medications, past family history, past medical history, past social history, past surgical history and problem list.   Objective:   Vitals:   12/14/20 1013  BP: (!) 96/57  Pulse: 79  Weight: 124 lb 0.6 oz (56.3 kg)    Fetal Status: Fetal Heart Rate (bpm): 130   Movement: Present     General:  Alert, oriented and cooperative. Patient is in no acute distress.  Skin: Skin is warm and dry. No rash noted.   Cardiovascular: Normal heart rate noted  Respiratory: Normal respiratory effort, no problems with respiration noted  Abdomen: Soft, gravid, appropriate for gestational age.  Pain/Pressure: Present     Pelvic: Cervical exam performed in the presence of a chaperone      There is a single lesion of folliculitis on left labia, indurated, not fluctuant but has ruptured and is lightly bleeding.  Speculum exam:  Cervix long and closed with no bleeding  Extremities: Normal range of motion.  Edema: None  Mental Status: Normal mood and affect. Normal behavior. Normal judgment and thought content.   Assessment and Plan:  Pregnancy: [id number] at [redacted]w[redacted]d 1. [redacted] weeks gestation of pregnancy     Reviewed signs of PTL  2. Folliculitis     Warm soaks  Preterm labor symptoms and general obstetric precautions including but not limited to vaginal bleeding,  contractions, leaking of fluid and fetal movement were reviewed in detail with the patient. Please refer to After Visit Summary for other counseling recommendations.   Return in about 2 weeks (around 12/28/2020) for [facility].  Future Appointments  Date Time Provider Department Center  12/28/2020 10:35 AM Aviva Signs, CNM [facility] None  01/06/2021 11:15 AM [facility] NURSE [facility]  01/06/2021 11:30 AM [facility]3 [facility]  01/11/2021 10:15 AM Aviva Signs, CNM [facility] None  01/25/2021 10:35 AM Aviva Signs, CNM [facility] None  02/01/2021 10:35 AM Aviva Signs, CNM [facility] None  02/08/2021 10:35 AM Aviva Signs, CNM [facility] None    Wynelle Bourgeois, CNM

## 2020-12-14 NOTE — Patient Instructions (Signed)
Hemorrhoids Hemorrhoids are swollen veins that may develop:  In the butt (rectum). These are called internal hemorrhoids.  Around the opening of the butt (anus). These are called external hemorrhoids. Hemorrhoids can cause pain, itching, or bleeding. Most of the time, they do not cause serious problems. They usually get better with diet changes, lifestyle changes, and other home treatments. What are the causes? This condition may be caused by:  Having trouble pooping (constipation).  Pushing hard (straining) to poop.  Watery poop (diarrhea).  Pregnancy.  Being very overweight (obese).  Sitting for long periods of time.  Heavy lifting or other activity that causes you to strain.  Anal sex.  Riding a bike for a long period of time. What are the signs or symptoms? Symptoms of this condition include:  Pain.  Itching or soreness in the butt.  Bleeding from the butt.  Leaking poop.  Swelling in the area.  One or more lumps around the opening of your butt. How is this diagnosed? A doctor can often diagnose this condition by looking at the affected area. The doctor may also:  Do an exam that involves feeling the area with a gloved hand (digital rectal exam).  Examine the area inside your butt using a small tube (anoscope).  Order blood tests. This may be done if you have lost a lot of blood.  Have you get a test that involves looking inside the colon using a flexible tube with a camera on the end (sigmoidoscopy or colonoscopy). How is this treated? This condition can usually be treated at home. Your doctor may tell you to change what you eat, make lifestyle changes, or try home treatments. If these do not help, procedures can be done to remove the hemorrhoids or make them smaller. These may involve:  Placing rubber bands at the base of the hemorrhoids to cut off their blood supply.  Injecting medicine into the hemorrhoids to shrink them.  Shining a type of light  energy onto the hemorrhoids to cause them to fall off.  Doing surgery to remove the hemorrhoids or cut off their blood supply. Follow these instructions at home: Eating and drinking  Eat foods that have a lot of fiber in them. These include whole grains, beans, nuts, fruits, and vegetables.  Ask your doctor about taking products that have added fiber (fibersupplements).  Reduce the amount of fat in your diet. You can do this by: ? Eating low-fat dairy products. ? Eating less red meat. ? Avoiding processed foods.  Drink enough fluid to keep your pee (urine) pale yellow.   Managing pain and swelling  Take a warm-water bath (sitz bath) for 20 minutes to ease pain. Do this 3-4 times a day. You may do this in a bathtub or using a portable sitz bath that fits over the toilet.  If told, put ice on the painful area. It may be helpful to use ice between your warm baths. ? Put ice in a plastic bag. ? Place a towel between your skin and the bag. ? Leave the ice on for 20 minutes, 2-3 times a day.   General instructions  Take over-the-counter and prescription medicines only as told by your doctor. ? Medicated creams and medicines may be used as told.  Exercise often. Ask your doctor how much and what kind of exercise is best for you.  Go to the bathroom when you have the urge to poop. Do not wait.  Avoid pushing too hard when you poop.  Keep your butt dry and clean. Use wet toilet paper or moist towelettes after pooping.  Do not sit on the toilet for a long time.  Keep all follow-up visits as told by your doctor. This is important. Contact a doctor if you:  Have pain and swelling that do not get better with treatment or medicine.  Have trouble pooping.  Cannot poop.  Have pain or swelling outside the area of the hemorrhoids. Get help right away if you have:  Bleeding that will not stop. Summary  Hemorrhoids are swollen veins in the butt or around the opening of the  butt.  They can cause pain, itching, or bleeding.  Eat foods that have a lot of fiber in them. These include whole grains, beans, nuts, fruits, and vegetables.  Take a warm-water bath (sitz bath) for 20 minutes to ease pain. Do this 3-4 times a day. This information is not intended to replace advice given to you by your health care provider. Make sure you discuss any questions you have with your health care provider. Document Revised: 07/11/2018 Document Reviewed: 11/22/2017 Elsevier Patient Education  2021 Elsevier Inc. Folliculitis  Folliculitis is inflammation of the hair follicles. Folliculitis most commonly occurs on the scalp, thighs, legs, back, and buttocks. However, it can occur anywhere on the body. What are the causes? This condition may be caused by:  A bacterial infection (common).  A fungal infection.  A viral infection.  Contact with certain chemicals, especially oils and tars.  Shaving or waxing.  Greasy ointments or creams applied to the skin. Long-lasting folliculitis and folliculitis that keeps coming back may be caused by bacteria. This bacteria can live anywhere on your skin and is often found in the nostrils. What increases the risk? You are more likely to develop this condition if you have:  A weakened immune system.  Diabetes.  Obesity. What are the signs or symptoms? Symptoms of this condition include:  Redness.  Soreness.  Swelling.  Itching.  Small white or yellow, pus-filled, itchy spots (pustules) that appear over a reddened area. If there is an infection that goes deep into the follicle, these may develop into a boil (furuncle).  A group of closely packed boils (carbuncle). These tend to form in hairy, sweaty areas of the body. How is this diagnosed? This condition is diagnosed with a skin exam. To find what is causing the condition, your health care provider may take a sample of one of the pustules or boils for testing in a lab. How  is this treated? This condition may be treated by:  Applying warm compresses to the affected areas.  Taking an antibiotic medicine or applying an antibiotic medicine to the skin.  Applying or bathing with an antiseptic solution.  Taking an over-the-counter medicine to help with itching.  Having a procedure to drain any pustules or boils. This may be done if a pustule or boil contains a lot of pus or fluid.  Having laser hair removal. This may be done to treat long-lasting folliculitis. Follow these instructions at home: Managing pain and swelling  If directed, apply heat to the affected area as often as told by your health care provider. Use the heat source that your health care provider recommends, such as a moist heat pack or a heating pad. ? Place a towel between your skin and the heat source. ? Leave the heat on for 20-30 minutes. ? Remove the heat if your skin turns bright red. This is especially important if  you are unable to feel pain, heat, or cold. You may have a greater risk of getting burned.   General instructions  If you were prescribed an antibiotic medicine, take it or apply it as told by your health care provider. Do not stop using the antibiotic even if your condition improves.  Check the irritated area every day for signs of infection. Check for: ? Redness, swelling, or pain. ? Fluid or blood. ? Warmth. ? Pus or a bad smell.  Do not shave irritated skin.  Take over-the-counter and prescription medicines only as told by your health care provider.  Keep all follow-up visits as told by your health care provider. This is important. Get help right away if:  You have more redness, swelling, or pain in the affected area.  Red streaks are spreading from the affected area.  You have a fever. Summary  Folliculitis is inflammation of the hair follicles. Folliculitis most commonly occurs on the scalp, thighs, legs, back, and buttocks.  This condition may be  treated by taking an antibiotic medicine or applying an antibiotic medicine to the skin, and applying or bathing with an antiseptic solution.  If you were prescribed an antibiotic medicine, take it or apply it as told by your health care provider. Do not stop using the antibiotic even if your condition improves.  Get help right away if you have new or worsening symptoms.  Keep all follow-up visits as told by your health care provider. This is important. This information is not intended to replace advice given to you by your health care provider. Make sure you discuss any questions you have with your health care provider. Document Revised: 02/09/2018 Document Reviewed: 02/09/2018 Elsevier Patient Education  2021 [employer].

## 2020-12-15 DIAGNOSIS — Z419 Encounter for procedure for purposes other than remedying health state, unspecified: Secondary | ICD-10-CM | POA: Diagnosis not present

## 2020-12-28 ENCOUNTER — Other Ambulatory Visit: Payer: Self-pay

## 2020-12-28 ENCOUNTER — Ambulatory Visit (INDEPENDENT_AMBULATORY_CARE_PROVIDER_SITE_OTHER): Payer: Medicaid Other | Admitting: Advanced Practice Midwife

## 2020-12-28 VITALS — BP 100/55 | HR 71 | Wt 125.0 lb

## 2020-12-28 DIAGNOSIS — Z3A32 32 weeks gestation of pregnancy: Secondary | ICD-10-CM

## 2020-12-28 DIAGNOSIS — L739 Follicular disorder, unspecified: Secondary | ICD-10-CM

## 2020-12-28 DIAGNOSIS — Z8751 Personal history of pre-term labor: Secondary | ICD-10-CM

## 2020-12-28 NOTE — Progress Notes (Signed)
   PRENATAL VISIT NOTE  Subjective:  Elizabeth [name] is a 25 y.o. [phone] at [redacted]w[redacted]d being seen today for ongoing prenatal care.  She is currently monitored for the following issues for this high-risk pregnancy and has Supervision of other normal pregnancy, antepartum; H/O incompetent cervix, currently pregnant; and HSV infection on their problem list.  Patient reports occasional contractions and folliculitis resolved .  Contractions: Irritability. Vag. Bleeding: None.  Movement: Present. Denies leaking of fluid.   The following portions of the patient's history were reviewed and updated as appropriate: allergies, current medications, past family history, past medical history, past social history, past surgical history and problem list.   Objective:   Vitals:   12/28/20 1041  BP: (!) 100/55  Pulse: 71  Weight: 125 lb (56.7 kg)    Fetal Status: Fetal Heart Rate (bpm): 148   Movement: Present     General:  Alert, oriented and cooperative. Patient is in no acute distress.  Skin: Skin is warm and dry. No rash noted.   Cardiovascular: Normal heart rate noted  Respiratory: Normal respiratory effort, no problems with respiration noted  Abdomen: Soft, gravid, appropriate for gestational age.  Pain/Pressure: Present     Pelvic: Cervical exam deferred        Extremities: Normal range of motion.  Edema: None  Mental Status: Normal mood and affect. Normal behavior. Normal judgment and thought content.   Assessment and Plan:  Pregnancy: [id number] at [redacted]w[redacted]d 1. [redacted] weeks gestation of pregnancy   2. Folliculitis     Resolved on its own  3. History of preterm delivery      Occasional pressure and scattered contractions, not frequent     Reviewed recommendation to come to MAU for >5UC/hr or other signs of PTL   Preterm labor symptoms and general obstetric precautions including but not limited to vaginal bleeding, contractions, leaking of fluid and fetal movement were reviewed in detail with the  patient. Please refer to After Visit Summary for other counseling recommendations.    Future Appointments  Date Time Provider Department Center  01/06/2021 11:15 AM [facility] NURSE [facility]  01/06/2021 11:30 AM [facility] US3 [facility]  01/11/2021 10:15 AM Aviva Signs, CNM [facility] None  01/25/2021 10:35 AM Aviva Signs, CNM [facility] None  02/01/2021 10:35 AM Aviva Signs, CNM [facility] None  02/08/2021 10:35 AM Aviva Signs, CNM [facility] None    Wynelle Bourgeois, CNM

## 2021-01-03 ENCOUNTER — Encounter (HOSPITAL_COMMUNITY): Payer: Self-pay | Admitting: Obstetrics & Gynecology

## 2021-01-03 ENCOUNTER — Inpatient Hospital Stay (HOSPITAL_COMMUNITY)
Admission: AD | Admit: 2021-01-03 | Discharge: 2021-01-03 | Disposition: A | Discharge status: Home / Self Care | Payer: Medicaid Other | Attending: Obstetrics & Gynecology | Admitting: Obstetrics & Gynecology

## 2021-01-03 ENCOUNTER — Other Ambulatory Visit: Payer: Self-pay

## 2021-01-03 DIAGNOSIS — F17210 Nicotine dependence, cigarettes, uncomplicated: Secondary | ICD-10-CM | POA: Insufficient documentation

## 2021-01-03 DIAGNOSIS — O99333 Smoking (tobacco) complicating pregnancy, third trimester: Secondary | ICD-10-CM | POA: Insufficient documentation

## 2021-01-03 DIAGNOSIS — O4703 False labor before 37 completed weeks of gestation, third trimester: Secondary | ICD-10-CM | POA: Diagnosis not present

## 2021-01-03 DIAGNOSIS — R109 Unspecified abdominal pain: Secondary | ICD-10-CM | POA: Diagnosis not present

## 2021-01-03 DIAGNOSIS — Z3A33 33 weeks gestation of pregnancy: Secondary | ICD-10-CM | POA: Insufficient documentation

## 2021-01-03 DIAGNOSIS — O99891 Other specified diseases and conditions complicating pregnancy: Secondary | ICD-10-CM | POA: Diagnosis not present

## 2021-01-03 DIAGNOSIS — Z3480 Encounter for supervision of other normal pregnancy, unspecified trimester: Secondary | ICD-10-CM

## 2021-01-03 DIAGNOSIS — O4700 False labor before 37 completed weeks of gestation, unspecified trimester: Secondary | ICD-10-CM

## 2021-01-03 LAB — GC/CHLAMYDIA PROBE AMP (~~LOC~~) NOT AT ARMC
Chlamydia: NEGATIVE
Comment: NEGATIVE
Comment: NORMAL
Neisseria Gonorrhea: NEGATIVE

## 2021-01-03 LAB — RAPID URINE DRUG SCREEN, HOSP PERFORMED
Amphetamines: NOT DETECTED
Barbiturates: NOT DETECTED
Benzodiazepines: NOT DETECTED
Cocaine: NOT DETECTED
Opiates: NOT DETECTED
Tetrahydrocannabinol: NOT DETECTED

## 2021-01-03 LAB — FETAL FIBRONECTIN: Fetal Fibronectin: NEGATIVE

## 2021-01-03 LAB — URINALYSIS, ROUTINE W REFLEX MICROSCOPIC
Bilirubin Urine: NEGATIVE
Glucose, UA: 100 mg/dL — AB
Hgb urine dipstick: NEGATIVE
Ketones, ur: 15 mg/dL — AB
Leukocytes,Ua: NEGATIVE
Nitrite: NEGATIVE
Protein, ur: NEGATIVE mg/dL
Specific Gravity, Urine: 1.015 (ref 1.005–1.030)
pH: 7 (ref 5.0–8.0)

## 2021-01-03 LAB — WET PREP, GENITAL
Clue Cells Wet Prep HPF POC: NONE SEEN
Sperm: NONE SEEN
Trich, Wet Prep: NONE SEEN
Yeast Wet Prep HPF POC: NONE SEEN

## 2021-01-03 MED ORDER — NIFEDIPINE 10 MG PO CAPS
10.0000 mg | ORAL_CAPSULE | ORAL | Status: DC | PRN
  Administered 2021-01-03: 10 mg via ORAL
  Filled 2021-01-03: qty 1

## 2021-01-03 NOTE — MAU Provider Note (Signed)
Patient Elizabeth [name] is a 25 y.o. [id number]  At [redacted]w[redacted]d here with complaints of abdominal pain at the top of her uterus and also feeling pelvic pressure.  The pain started yesterday but worse this evening. Her history is significant for short cervix in the past, with a delivery at 36 weeks. She has had MFM [place] and cervix was normal length at [facility]. Per chart review, she is not currently on Makena.    Patient reports that she ate cheese this evening, and she has lactose intolerance. She says "I forgot about that, that could be why my stomach hurts". 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 She was at work for 5 hours today.  SHe also reports feeling hot and nauseated. She feels like she needs to have a BM, reports constipation but also had "large" BM today.   SHe denies LOF, decreased fetal movements, VB, dysuria. She endorses some white and clear vaginal discharge but denies other complaints.  History     CSN: [id number]  Arrival date and time: 01/03/21 0108   None     Chief Complaint  Patient presents with   Dysuria   Contractions   Abdominal Pain This is a new problem. The current episode started yesterday. The problem occurs intermittently. Pain location: top of uterus. The pain is at a severity of 7/10. The abdominal pain does not radiate. Associated symptoms include constipation. Pertinent negatives include no dysuria, fever, frequency, nausea or vomiting.   OB History     Gravida  5   Para  1   Term  0   Preterm  1   AB  3   Living  1      SAB  2   IAB  1   Ectopic      Multiple      Live Births  1           Past Medical History:  Diagnosis Date   Vaginal Pap smear, abnormal     History reviewed. No pertinent surgical history.  Family History  Problem Relation Age of Onset   Hypertension Mother    Cancer Neg Hx    Diabetes Neg Hx     Social History   Tobacco Use   Smoking status: Some Days    Packs/day: 0.25    Pack  years: 0.00    Types: Cigarettes   Smokeless tobacco: Never  Vaping Use   Vaping Use: Never used  Substance Use Topics   Alcohol use: Not Currently   Drug use: Not Currently    Allergies:  Allergies  Allergen Reactions   Kiwi Extract Other (See Comments)    Numbness and tingling in mouth when eating kiwi    Medications Prior to Admission  Medication Sig Dispense Refill Last Dose   Prenatal Vit-Fe Fumarate-FA (MULTIVITAMIN-PRENATAL) 27-0.8 MG TABS tablet Take 1 tablet by mouth daily at 12 noon.   01/02/2021   docusate sodium (COLACE) 100 MG capsule Take 1 capsule (100 mg total) by mouth 2 (two) times daily as needed. 100 capsule 2    hydrocortisone-pramoxine (PROCTOFOAM HC) rectal foam Place 1 applicator rectally 2 (two) times daily. 10 g 3    metroNIDAZOLE (FLAGYL) 500 MG tablet Take 1 tablet (500 mg total) by mouth 2 (two) times daily. (Patient not taking: No sig reported) 14 tablet 0    progesterone 200 MG SUPP Place 200 mg vaginally at bedtime. (Patient not taking: No sig reported)  terconazole (TERAZOL 7) 0.4 % vaginal cream Apply 1 applicator vaginally every night at bedtime for 3 days. (Patient not taking: No sig reported) 45 g 0     Review of Systems  Constitutional: Negative.  Negative for fever.  HENT: Negative.    Respiratory: Negative.    Cardiovascular: Negative.   Gastrointestinal:  Positive for abdominal pain and constipation. Negative for nausea and vomiting.  Genitourinary:  Negative for dysuria and frequency.  Neurological: Negative.   Hematological: Negative.   Psychiatric/Behavioral: Negative.    Physical Exam   Blood pressure (!) 102/56, pulse 90, temperature 98.5 F (36.9 C), temperature source Oral, resp. rate 18, height 5\' 4"  (1.626 m), weight 59 kg, SpO2 100 %.  Physical Exam Constitutional:      Appearance: Normal appearance.  Pulmonary:     Effort: Pulmonary effort is normal.  Abdominal:     General: Abdomen is flat. There is no distension.      Palpations: Abdomen is soft.     Tenderness: There is no abdominal tenderness. There is no guarding.  Genitourinary:    General: Normal vulva.  Musculoskeletal:        General: Normal range of motion.  Neurological:     Mental Status: She is alert.    MAU Course  Procedures Upon entering the room, patient was very restless in bed, scratching arms and legs and had removed most of her gown and blankets. She appeared distressed.  -checked cervix and patient is 1 cm, thick. FFN sent -had BM in MAU and reports that she feels better.  -NST: 135 bpm, mod var, present acel, no decels, uterine irratability -will try procardia one dose, due to already low BP   0325: patient sleeping in bed, woken up to give first dose of procardia FFN is negative UDS is negative  Patient slept for 45 min while in MAU, she reports no contractions and does not want cervical check  Wet prep is negative Assessment and Plan   1. Supervision of other normal pregnancy, antepartum   2. Preterm contractions   -Patient not in labor, after 1 dose of procardia and rest, she denies pain at this time.  - stable for discharge -discussed her dairy intake, encouraged patient to follow diet and to avoid dairy -patient to keep follow up appt on June 28 -toco was quiescent on discharge -GC CT pending  June 30 01/03/2021, 2:37 AM

## 2021-01-03 NOTE — MAU Note (Signed)
Pt reports contractions for the last few days, pressure in vaginal area for the last 24 hours. Denies dysuria or vaginal bleeding. Decreased fetal movement for the last 24 hours.

## 2021-01-06 ENCOUNTER — Ambulatory Visit: Payer: Medicaid Other

## 2021-01-11 ENCOUNTER — Other Ambulatory Visit (HOSPITAL_BASED_OUTPATIENT_CLINIC_OR_DEPARTMENT_OTHER): Payer: Self-pay

## 2021-01-11 ENCOUNTER — Other Ambulatory Visit (HOSPITAL_COMMUNITY)
Admission: RE | Admit: 2021-01-11 | Discharge: 2021-01-11 | Disposition: A | Discharge status: Home / Self Care | Payer: Medicaid Other | Source: Ambulatory Visit | Attending: Advanced Practice Midwife | Admitting: Advanced Practice Midwife

## 2021-01-11 ENCOUNTER — Ambulatory Visit (INDEPENDENT_AMBULATORY_CARE_PROVIDER_SITE_OTHER): Payer: Medicaid Other | Admitting: Advanced Practice Midwife

## 2021-01-11 ENCOUNTER — Other Ambulatory Visit: Payer: Self-pay

## 2021-01-11 ENCOUNTER — Encounter: Payer: Self-pay | Admitting: Advanced Practice Midwife

## 2021-01-11 VITALS — BP 107/56 | HR 78 | Wt 129.0 lb

## 2021-01-11 DIAGNOSIS — B379 Candidiasis, unspecified: Secondary | ICD-10-CM

## 2021-01-11 DIAGNOSIS — O09893 Supervision of other high risk pregnancies, third trimester: Secondary | ICD-10-CM | POA: Diagnosis not present

## 2021-01-11 DIAGNOSIS — O3433 Maternal care for cervical incompetence, third trimester: Secondary | ICD-10-CM | POA: Insufficient documentation

## 2021-01-11 DIAGNOSIS — O23593 Infection of other part of genital tract in pregnancy, third trimester: Secondary | ICD-10-CM | POA: Diagnosis not present

## 2021-01-11 DIAGNOSIS — O2643 Herpes gestationis, third trimester: Secondary | ICD-10-CM | POA: Diagnosis not present

## 2021-01-11 DIAGNOSIS — Z3A34 34 weeks gestation of pregnancy: Secondary | ICD-10-CM | POA: Diagnosis not present

## 2021-01-11 DIAGNOSIS — N898 Other specified noninflammatory disorders of vagina: Secondary | ICD-10-CM

## 2021-01-11 MED ORDER — TERCONAZOLE 0.4 % VA CREA
TOPICAL_CREAM | VAGINAL | 0 refills | Status: DC
  Filled 2021-01-11: qty 45, 7d supply, fill #0

## 2021-01-11 MED ORDER — VALACYCLOVIR HCL 500 MG PO TABS
500.0000 mg | ORAL_TABLET | Freq: Two times a day (BID) | ORAL | 6 refills | Status: DC
  Filled 2021-01-11: qty 30, 15d supply, fill #0

## 2021-01-11 NOTE — Progress Notes (Signed)
   PRENATAL VISIT NOTE  Subjective:  Elizabeth [name] is a 25 y.o. [id number] at [redacted]w[redacted]d being seen today for ongoing prenatal care.  She is currently monitored for the following issues for this low-risk pregnancy and has Supervision of other normal pregnancy, antepartum; H/O incompetent cervix, currently pregnant; and HSV infection on their problem list.  Patient reports vaginal irritation.  Contractions: Irregular. Vag. Bleeding: None.  Movement: Present. Denies leaking of fluid.   The following portions of the patient's history were reviewed and updated as appropriate: allergies, current medications, past family history, past medical history, past social history, past surgical history and problem list.   Objective:   Vitals:   01/11/21 1010  BP: (!) 107/56  Pulse: 78  Weight: 129 lb (58.5 kg)    Fetal Status: Fetal Heart Rate (bpm): 140 Fundal Height: 33 cm Movement: Present     General:  Alert, oriented and cooperative. Patient is in no acute distress.  Skin: Skin is warm and dry. No rash noted.   Cardiovascular: Normal heart rate noted  Respiratory: Normal respiratory effort, no problems with respiration noted  Abdomen: Soft, gravid, appropriate for gestational age.  Pain/Pressure: Present     Pelvic: Cervical exam deferred      Some erethema of labia.  No lesions. Wet prep sent  Extremities: Normal range of motion.  Edema: None  Mental Status: Normal mood and affect. Normal behavior. Normal judgment and thought content.   Assessment and Plan:  Pregnancy: G5P0131 at [redacted]w[redacted]d 1. [redacted] weeks gestation of pregnancy      No PTL  - Cervicovaginal ancillary only( [place])  2. Yeast infection      Will treat presumptively      Recommend Probiotics - Cervicovaginal ancillary only( ) - terconazole (TERAZOL 7) 0.4 % vaginal cream; Apply 1 applicator vaginally every night at bedtime for 3 days.  Dispense: 45 g; Refill: 0  3. Vaginal irritation      New Rx for Valtrex  suppression sent  Preterm labor symptoms and general obstetric precautions including but not limited to vaginal bleeding, contractions, leaking of fluid and fetal movement were reviewed in detail with the patient. Please refer to After Visit Summary for other counseling recommendations.   Return in about 2 weeks (around 01/25/2021) for [facility].  Future Appointments  Date Time Provider Department Center  01/14/2021  1:45 PM [facility] NURSE [facility]  01/14/2021  2:00 PM [facility] US1 [facility]  01/25/2021 10:35 AM Aviva Signs, CNM [facility] None  02/01/2021 10:35 AM Aviva Signs, CNM [facility] None  02/08/2021 10:35 AM Aviva Signs, CNM [facility] None    Wynelle Bourgeois, CNM

## 2021-01-13 LAB — CERVICOVAGINAL ANCILLARY ONLY
Bacterial Vaginitis (gardnerella): NEGATIVE
Candida Glabrata: NEGATIVE
Candida Vaginitis: POSITIVE — AB
Comment: NEGATIVE
Comment: NEGATIVE
Comment: NEGATIVE

## 2021-01-14 ENCOUNTER — Other Ambulatory Visit: Payer: Self-pay

## 2021-01-14 ENCOUNTER — Other Ambulatory Visit: Payer: Self-pay | Admitting: Obstetrics

## 2021-01-14 ENCOUNTER — Ambulatory Visit: Payer: Medicaid Other | Attending: Maternal & Fetal Medicine | Admitting: *Deleted

## 2021-01-14 ENCOUNTER — Ambulatory Visit (HOSPITAL_BASED_OUTPATIENT_CLINIC_OR_DEPARTMENT_OTHER): Payer: Medicaid Other

## 2021-01-14 ENCOUNTER — Encounter: Payer: Self-pay | Admitting: *Deleted

## 2021-01-14 VITALS — BP 117/60 | HR 83

## 2021-01-14 DIAGNOSIS — Z362 Encounter for other antenatal screening follow-up: Secondary | ICD-10-CM

## 2021-01-14 DIAGNOSIS — O35EXX0 Maternal care for other (suspected) fetal abnormality and damage, fetal genitourinary anomalies, not applicable or unspecified: Secondary | ICD-10-CM

## 2021-01-14 DIAGNOSIS — O358XX0 Maternal care for other (suspected) fetal abnormality and damage, not applicable or unspecified: Secondary | ICD-10-CM | POA: Insufficient documentation

## 2021-01-14 DIAGNOSIS — O09219 Supervision of pregnancy with history of pre-term labor, unspecified trimester: Secondary | ICD-10-CM | POA: Diagnosis not present

## 2021-01-14 DIAGNOSIS — O09213 Supervision of pregnancy with history of pre-term labor, third trimester: Secondary | ICD-10-CM | POA: Diagnosis not present

## 2021-01-14 DIAGNOSIS — O283 Abnormal ultrasonic finding on antenatal screening of mother: Secondary | ICD-10-CM | POA: Diagnosis not present

## 2021-01-14 DIAGNOSIS — Z3480 Encounter for supervision of other normal pregnancy, unspecified trimester: Secondary | ICD-10-CM

## 2021-01-14 DIAGNOSIS — O3433 Maternal care for cervical incompetence, third trimester: Secondary | ICD-10-CM | POA: Insufficient documentation

## 2021-01-14 DIAGNOSIS — Z419 Encounter for procedure for purposes other than remedying health state, unspecified: Secondary | ICD-10-CM | POA: Diagnosis not present

## 2021-01-14 DIAGNOSIS — Z3A34 34 weeks gestation of pregnancy: Secondary | ICD-10-CM | POA: Insufficient documentation

## 2021-01-23 ENCOUNTER — Other Ambulatory Visit: Payer: Self-pay

## 2021-01-23 ENCOUNTER — Encounter (HOSPITAL_COMMUNITY): Payer: Self-pay | Admitting: Family Medicine

## 2021-01-23 ENCOUNTER — Inpatient Hospital Stay (HOSPITAL_COMMUNITY)
Admission: AD | Admit: 2021-01-23 | Discharge: 2021-01-24 | Disposition: A | Discharge status: Home / Self Care | Payer: Medicaid Other | Attending: Family Medicine | Admitting: Family Medicine

## 2021-01-23 DIAGNOSIS — O479 False labor, unspecified: Secondary | ICD-10-CM

## 2021-01-23 DIAGNOSIS — Z3A35 35 weeks gestation of pregnancy: Secondary | ICD-10-CM | POA: Insufficient documentation

## 2021-01-23 DIAGNOSIS — O471 False labor at or after 37 completed weeks of gestation: Secondary | ICD-10-CM | POA: Insufficient documentation

## 2021-01-23 DIAGNOSIS — Z3A36 36 weeks gestation of pregnancy: Secondary | ICD-10-CM

## 2021-01-23 DIAGNOSIS — Z79899 Other long term (current) drug therapy: Secondary | ICD-10-CM | POA: Insufficient documentation

## 2021-01-23 DIAGNOSIS — Z3689 Encounter for other specified antenatal screening: Secondary | ICD-10-CM | POA: Insufficient documentation

## 2021-01-23 HISTORY — DX: Personal history of other infectious and parasitic diseases: Z86.19

## 2021-01-23 NOTE — MAU Provider Note (Signed)
Event Date/Time   First Provider Initiated Contact with Patient 01/23/21 2204       S: [name] is a 25 y.o. [id number] at [redacted]w[redacted]d  who presents to MAU today complaining contractions q unknown minutes since early this morning. She denies vaginal bleeding. She denies LOF. She reports normal fetal movement.  While in MAU patient reports she vomited x1 and now feels much better and reports her contractions have resolved.  O: BP (!) (P) 101/40 (BP Location: Right Arm)   Pulse 64   Temp 98.7 F (37.1 C) (Oral)   Resp (P) 17   Wt 60.4 kg   SpO2 98%   BMI 22.85 kg/m   Patient Vitals for the past 24 hrs:  BP Temp Temp src Pulse Resp SpO2 Weight  01/24/21 0017 (!) 101/40 -- -- 64 (P) 17 -- --  01/24/21 0015 -- -- -- -- -- 98 % --  01/23/21 2230 -- -- -- -- -- 99 % --  01/23/21 2110 (!) 112/56 98.7 F (37.1 C) Oral 79 18 100 % 60.4 kg   GENERAL: Well-developed, well-nourished female in no acute distress.  HEAD: Normocephalic, atraumatic.  CHEST: Normal effort of breathing, regular heart rate ABDOMEN: Soft, nontender, gravid  Cervical exam: 1/long/posterior Repeat cervical exam 2 hours later: 2.5/30/posterior with minimal bloody show Recheck 4 hours after initial exam: unchanged from 2nd exam and patient reports ctx have stopped Dilation: 2.5 Exam by:: N.Nuggent NP  Fetal Monitoring: reactive Baseline: 130 Variability: moderate Accelerations: present, 15x15 Decelerations: single at 2320, toco not connected at this time, patient monitored for an additional 2 hours after this time without abnormalities in tracing Contractions: present, irregular  A: SIUP at [redacted]w[redacted]d  False labor  P: 1. False labor   2. [redacted] weeks gestation of pregnancy   3. NST (non-stress test) reactive    Allergies as of 01/24/2021       Reactions   Kiwi Extract Other (See Comments)   Numbness and tingling in mouth when eating kiwi        Medication List     TAKE these medications    docusate  sodium 100 MG capsule Commonly known as: COLACE Take 1 capsule (100 mg total) by mouth 2 (two) times daily as needed.   Proctofoam HC rectal foam Generic drug: hydrocortisone-pramoxine Place 1 applicator rectally 2 (two) times daily.   valACYclovir 500 MG tablet Commonly known as: VALTREX Take 1 tablet (500 mg total) by mouth 2 (two) times daily.       ASK your doctor about these medications    metroNIDAZOLE 500 MG tablet Commonly known as: FLAGYL Take 1 tablet (500 mg total) by mouth 2 (two) times daily.   multivitamin-prenatal 27-0.8 MG Tabs tablet Take 1 tablet by mouth daily at 12 noon.   progesterone 200 MG Supp Place 200 mg vaginally at bedtime.   terconazole 0.4 % vaginal cream Commonly known as: TERAZOL 7 Apply 1 applicator vaginally every night at bedtime for 3 days.       -labor precautions given -return MAU precautions given -pt discharged to home in stable condition  Colleen Kotlarz, Odie Sera, NP 01/24/2021 2:17 AM

## 2021-01-23 NOTE — MAU Note (Signed)
G5P1 at 35.6 weeks arrived with complaints of feeling tightening "all day" and an hour ago began noticing more defined contractions. Pt denies SROM, vaginal bleeding or bloody show. Endorses + fetal movement.

## 2021-01-24 DIAGNOSIS — Z3A36 36 weeks gestation of pregnancy: Secondary | ICD-10-CM

## 2021-01-24 DIAGNOSIS — O4703 False labor before 37 completed weeks of gestation, third trimester: Secondary | ICD-10-CM | POA: Diagnosis not present

## 2021-01-24 DIAGNOSIS — Z3A35 35 weeks gestation of pregnancy: Secondary | ICD-10-CM | POA: Diagnosis not present

## 2021-01-24 DIAGNOSIS — O471 False labor at or after 37 completed weeks of gestation: Secondary | ICD-10-CM | POA: Diagnosis not present

## 2021-01-24 DIAGNOSIS — Z79899 Other long term (current) drug therapy: Secondary | ICD-10-CM | POA: Diagnosis not present

## 2021-01-24 DIAGNOSIS — Z3689 Encounter for other specified antenatal screening: Secondary | ICD-10-CM | POA: Diagnosis not present

## 2021-01-25 ENCOUNTER — Encounter: Payer: Self-pay | Admitting: Advanced Practice Midwife

## 2021-01-25 ENCOUNTER — Ambulatory Visit (INDEPENDENT_AMBULATORY_CARE_PROVIDER_SITE_OTHER): Payer: Medicaid Other | Admitting: Advanced Practice Midwife

## 2021-01-25 ENCOUNTER — Other Ambulatory Visit: Payer: Self-pay

## 2021-01-25 ENCOUNTER — Other Ambulatory Visit (HOSPITAL_COMMUNITY)
Admission: RE | Admit: 2021-01-25 | Discharge: 2021-01-25 | Disposition: A | Discharge status: Home / Self Care | Payer: Medicaid Other | Source: Ambulatory Visit | Attending: Advanced Practice Midwife | Admitting: Advanced Practice Midwife

## 2021-01-25 VITALS — BP 100/58 | HR 83 | Wt 131.0 lb

## 2021-01-25 DIAGNOSIS — Z3A36 36 weeks gestation of pregnancy: Secondary | ICD-10-CM | POA: Insufficient documentation

## 2021-01-25 DIAGNOSIS — Z3493 Encounter for supervision of normal pregnancy, unspecified, third trimester: Secondary | ICD-10-CM | POA: Insufficient documentation

## 2021-01-25 DIAGNOSIS — O26843 Uterine size-date discrepancy, third trimester: Secondary | ICD-10-CM

## 2021-01-25 NOTE — Progress Notes (Signed)
   PRENATAL VISIT NOTE  Subjective:  Elizabeth [name] is a 25 y.o. [id number] at [redacted]w[redacted]d being seen today for ongoing prenatal care.  She is currently monitored for the following issues for this high-risk pregnancy and has Supervision of other normal pregnancy, antepartum; H/O incompetent cervix, currently pregnant; and HSV infection on their problem list.  Patient reports occasional contractions.  Contractions: Irregular. Vag. Bleeding: None.  Movement: Present. Denies leaking of fluid.   The following portions of the patient's history were reviewed and updated as appropriate: allergies, current medications, past family history, past medical history, past social history, past surgical history and problem list.   Objective:   Vitals:   01/25/21 1044  BP: (!) 100/58  Pulse: 83  Weight: 131 lb (59.4 kg)    Fetal Status: Fetal Heart Rate (bpm): 136   Movement: Present     General:  Alert, oriented and cooperative. Patient is in no acute distress.  Skin: Skin is warm and dry. No rash noted.   Cardiovascular: Normal heart rate noted  Respiratory: Normal respiratory effort, no problems with respiration noted  Abdomen: Soft, gravid, appropriate for gestational age.  Pain/Pressure: Present     Pelvic: Cervical exam performed in the presence of a chaperone        Extremities: Normal range of motion.  Edema: None  Mental Status: Normal mood and affect. Normal behavior. Normal judgment and thought content.   Assessment and Plan:  Pregnancy: G5P0131 at [redacted]w[redacted]d 1. [redacted] weeks gestation of pregnancy   - Culture, beta strep (group b only) - GC/Chlamydia probe amp ([place])not at [facility]  2. Size of fetus inconsistent with dates in third trimester    Has [place] scheduled for growth  Preterm labor symptoms and general obstetric precautions including but not limited to vaginal bleeding, contractions, leaking of fluid and fetal movement were reviewed in detail with the patient. Please refer to After Visit  Summary for other counseling recommendations.   Return in about 1 week (around 02/01/2021) for [facility].  Future Appointments  Date Time Provider Department Center  02/01/2021 10:35 AM Aviva Signs, CNM [facility] None  02/04/2021  3:30 PM [facility] NURSE [facility]  02/04/2021  3:45 PM [facility] US1 [facility]  02/08/2021 10:35 AM Mayford Knife Elby Showers, CNM [facility] None    Wynelle Bourgeois, CNM

## 2021-01-26 LAB — GC/CHLAMYDIA PROBE AMP (~~LOC~~) NOT AT ARMC
Chlamydia: NEGATIVE
Comment: NEGATIVE
Comment: NORMAL
Neisseria Gonorrhea: NEGATIVE

## 2021-01-29 LAB — CULTURE, BETA STREP (GROUP B ONLY): Strep Gp B Culture: NEGATIVE

## 2021-02-01 ENCOUNTER — Ambulatory Visit (INDEPENDENT_AMBULATORY_CARE_PROVIDER_SITE_OTHER): Payer: Medicaid Other

## 2021-02-01 ENCOUNTER — Other Ambulatory Visit: Payer: Self-pay

## 2021-02-01 ENCOUNTER — Inpatient Hospital Stay (HOSPITAL_BASED_OUTPATIENT_CLINIC_OR_DEPARTMENT_OTHER): Payer: Medicaid Other

## 2021-02-01 ENCOUNTER — Inpatient Hospital Stay (HOSPITAL_COMMUNITY)
Admission: AD | Admit: 2021-02-01 | Discharge: 2021-02-01 | Disposition: A | Discharge status: Home / Self Care | Payer: Medicaid Other | Attending: Obstetrics & Gynecology | Admitting: Obstetrics & Gynecology

## 2021-02-01 ENCOUNTER — Encounter (HOSPITAL_COMMUNITY): Payer: Self-pay | Admitting: Obstetrics & Gynecology

## 2021-02-01 VITALS — BP 104/62 | HR 83 | Wt 133.0 lb

## 2021-02-01 DIAGNOSIS — Z8619 Personal history of other infectious and parasitic diseases: Secondary | ICD-10-CM

## 2021-02-01 DIAGNOSIS — O4693 Antepartum hemorrhage, unspecified, third trimester: Secondary | ICD-10-CM | POA: Insufficient documentation

## 2021-02-01 DIAGNOSIS — O99333 Smoking (tobacco) complicating pregnancy, third trimester: Secondary | ICD-10-CM | POA: Diagnosis not present

## 2021-02-01 DIAGNOSIS — O09213 Supervision of pregnancy with history of pre-term labor, third trimester: Secondary | ICD-10-CM | POA: Diagnosis not present

## 2021-02-01 DIAGNOSIS — Z3A37 37 weeks gestation of pregnancy: Secondary | ICD-10-CM

## 2021-02-01 DIAGNOSIS — Z3480 Encounter for supervision of other normal pregnancy, unspecified trimester: Secondary | ICD-10-CM

## 2021-02-01 DIAGNOSIS — O283 Abnormal ultrasonic finding on antenatal screening of mother: Secondary | ICD-10-CM

## 2021-02-01 DIAGNOSIS — O3433 Maternal care for cervical incompetence, third trimester: Secondary | ICD-10-CM

## 2021-02-01 DIAGNOSIS — F17210 Nicotine dependence, cigarettes, uncomplicated: Secondary | ICD-10-CM | POA: Insufficient documentation

## 2021-02-01 LAB — URINALYSIS, ROUTINE W REFLEX MICROSCOPIC
Bacteria, UA: NONE SEEN
Bilirubin Urine: NEGATIVE
Glucose, UA: NEGATIVE mg/dL
Ketones, ur: NEGATIVE mg/dL
Leukocytes,Ua: NEGATIVE
Nitrite: NEGATIVE
Protein, ur: NEGATIVE mg/dL
RBC / HPF: >50 RBC/hpf — ABNORMAL HIGH (ref 0–5)
Specific Gravity, Urine: 1.017 (ref 1.005–1.030)
pH: 6 (ref 5.0–8.0)

## 2021-02-01 LAB — POCT FERN TEST: POCT Fern Test: NEGATIVE

## 2021-02-01 NOTE — Progress Notes (Signed)
LOW-RISK PREGNANCY OFFICE VISIT  Patient name: Elizabeth [name] MRN [id number]  Date of birth: 01/08/1996 Chief Complaint:   Routine Prenatal Visit  Subjective:   Elizabeth [name] is a 25 y.o. G5P0131 female at [redacted]w[redacted]d with an Estimated Date of Delivery: 02/21/21 being seen today for ongoing management of a low-risk pregnancy aeb has Supervision of other normal pregnancy, antepartum; H/O incompetent cervix, currently pregnant; and HSV infection on their problem list.  Patient presents today with contractions since last night and diarrhea .  She reports intermittent contractions. Patient also has questions regarding WB.  She is scheduled for class on July 21Patient endorses fetal movement. Patient denies abdominal cramping or contractions.  Patient denies vaginal concerns including abnormal discharge, leaking of fluid, and bleeding.  Contractions: Irregular. Vag. Bleeding: None.  Movement: Present.  Reviewed past medical,surgical, social, obstetrical and family history as well as problem list, medications and allergies.  Objective   Vitals:   02/01/21 1025  BP: 104/62  Pulse: 83  Weight: 133 lb (60.3 kg)  Body mass index is 22.83 kg/m.  Total Weight Gain:33 lb (15 kg)         Physical Examination:   General appearance: Well appearing, and in no distress  Mental status: Alert, oriented to person, place, and time  Skin: Warm & dry  Cardiovascular: Normal heart rate noted  Respiratory: Normal respiratory effort, no distress  Abdomen: Soft, gravid, nontender, AGA with Fundal Height: 37 cm  Pelvic: Cervical exam performed  Dilation: 2 Effacement (%): 50 Station: -2 Presentation: Vertex  Extremities: Edema: None  Fetal Status: Fetal Heart Rate (bpm): 142  Movement: Present   No results found for this or any previous visit (from the past 24 hour(s)).  Assessment & Plan:  Low-risk pregnancy of a 25 y.o., G5P0131 at [redacted]w[redacted]d with an Estimated Date of Delivery: 02/21/21   1. Supervision of  other normal pregnancy, antepartum -Anticipatory guidance for upcoming appts. -Patient to schedule next appt in 1 weeks for an in-person visit. -Discussed and reviewed postpartum planning including contraception, infant feedings and circumcision desires/costs. *Patient desires to breastfeed and is considering Depo for contraception. *Reviewed risks to abnormal uterine bleeding with depo administration during the first 2-3 doses. *Encouraged to contact office for additional management if this should occur. *Briefly discussed infant circumcision.  Patient says yes, but seems unsure.  Encouraged to discuss with family members and FOB.   2. [redacted] weeks gestation of pregnancy -Doing well -Limited discussion regarding waterbirth. -Informed that consent can be signed at hospital, but will not be signed today as class requirement has not been met. -Discussed risks of waterbirth as well as exclusion criteria including but not limited to fetal growth restriction, covid infection, NRFHT, and GHTN. -Patient verbalizes understanding and without questions/concerns.  3. History of herpes genitalis -Rx for Valtrex send June 28th. -No lesions noted on external genitalia with BME.      Meds: No orders of the defined types were placed in this encounter.  Labs/procedures today:  Lab Orders  No laboratory test(s) ordered today     Reviewed: Term labor symptoms and general obstetric precautions including but not limited to vaginal bleeding, contractions, leaking of fluid and fetal movement were reviewed in detail with the patient.  All questions were answered.  Follow-up: Return in about 1 week (around 02/08/2021) for LROB.  No orders of the defined types were placed in this encounter.  Jessica L Emly MSN, CNM 02/01/2021  

## 2021-02-01 NOTE — MAU Note (Signed)
.  Elizabeth [name] is a 25 y.o. at [redacted]w[redacted]d here in MAU reporting: x1 episode of heavy vaginal bleeding around 1230. States there was a lot of blood in the toilet and she passed a large clot. The bleeding was dark red. Denies LOF. Has not been feeling the baby move as much. No recent IC. Did have her cervix checked today and was 2cm.   Pain score: 0 Vitals:   02/01/21 1303  BP: 116/82  Pulse: 80  Resp: 17  Temp: 99.1 F (37.3 C)  SpO2: 100%     FHT:141 Lab orders placed from triage:  UA

## 2021-02-01 NOTE — Progress Notes (Signed)
Pt has had diarrhea since last night

## 2021-02-01 NOTE — Patient Instructions (Signed)
?  Considering Waterbirth? ?Guide for patients at [facility] ([facility] ?Why consider waterbirth? ?Gentle birth for babies  ?Less pain medicine used in labor  ?May allow for passive descent/less pushing  ?May reduce perineal tears  ?More mobility and instinctive maternal position changes  ?Increased maternal relaxation  ? ?Is waterbirth safe? What are the risks of infection, drowning or other complications? ?Infection:  ?Very low risk (3.7 % for tub vs 4.8% for bed)  ?7 in 8000 waterbirths with documented infection  ?Poorly cleaned equipment most common cause  ?Slightly lower group B strep transmission rate  ?Drowning  ?Maternal:  ?Very low risk  ?Related to seizures or fainting  ?Newborn:  ?Very low risk. No evidence of increased risk of respiratory problems in multiple large studies  ?Physiological protection from breathing under water  ?Avoid underwater birth if there are any fetal complications  ?Once baby's head is out of the water, keep it out.  ?Birth complication  ?Some reports of cord trauma, but risk decreased by bringing baby to surface gradually  ?No evidence of increased risk of shoulder dystocia. Mothers can usually change positions faster in water than in a bed, possibly aiding the maneuvers to free the shoulder.  ? ?There are 2 things you MUST do to have a waterbirth with [facility]: ?Attend a waterbirth class at [facility] [place]   ?3rd Wednesday of every month from 7-9 pm (virtual during COVID) ?Free ?Register online at [email or web address] or [email or web address] or by calling [phone] ?Bring us the certificate from the class to your prenatal appointment or send via MyChart ?Meet with a midwife at 36 weeks* to see if you can still plan a waterbirth and to sign the consent.  ? ?*We also recommend that you schedule as many of your prenatal visits with a midwife as possible.   ? ?Helpful information: ?You may want to bring a bathing suit top to the hospital  to wear during labor but this is optional.  All other supplies are provided by the hospital. ?Please arrive at the hospital with signs of active labor, and do not wait at home until late in labor. It takes 45 min- 2 hours for COVID testing, fetal monitoring, and check in to your room to take place, plus transport and filling of the waterbirth tub.   ? ?Things that would prevent you from having a waterbirth: ?Unknown or Positive COVID-19 diagnosis upon admission to hospital* ?Premature, <37wks  ?Previous cesarean birth  ?Presence of thick meconium-stained fluid  ?Multiple gestation (Twins, triplets, etc.)  ?Uncontrolled diabetes or gestational diabetes requiring medication  ?Hypertension diagnosed in pregnancy or preexisting hypertension (gestational hypertension, preeclampsia, or chronic hypertension) ?Fetal growth restriction (your baby measures less than 10th percentile on ultrasound) ?Heavy vaginal bleeding  ?Non-reassuring fetal heart rate  ?Active infection (MRSA, etc.). Group B Strep is NOT a contraindication for waterbirth.  ?If your labor has to be induced and induction method requires continuous monitoring of the baby's heart rate  ?Other risks/issues identified by your obstetrical provider  ? ?Please remember that birth is unpredictable. Under certain unforeseeable circumstances your provider may advise against giving birth in the tub. These decisions will be made on a case-by-case basis and with the safety of you and your baby as our highest priority. ? ? ?*Please remember that in order to have a waterbirth, you must test Negative to COVID-19 upon admission to the hospital. ? ?Updated 10/25/20 ? ?

## 2021-02-01 NOTE — MAU Provider Note (Signed)
Chief Complaint:  Vaginal Bleeding   HPI: Elizabeth [name] is a 25 y.o. [id number] at [redacted]w[redacted]d who presents to maternity admissions reporting vaginal bleeding. Patient reports she was in the office this morning for a routine OB appointment and had her cervix checked around 10:40am. Around 12:45 she walked to the bathroom and noticed she had some bleeding that got onto her clothes and as she sat down she noticed "a lot of blood in the toilet" an clot that was "about the size of a slug". She denies pain, only reports some mild tightness in her abdomen and pelvic pressure. No leaking fluid. She endorses active fetal movement, however not as strong as it usually is. Denies recent IC. Cervix was 2-3 cm in the office.   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    pt reports last outbreak years ago   Vaginal Pap smear, abnormal    OB History  Gravida Para Term Preterm AB Living  5 1 0 1 3 1   SAB IAB Ectopic Multiple Live Births  2 1     1     # Outcome Date GA Lbr Len/2nd Weight Sex Delivery Anes PTL Lv  5 Current           4 Preterm 01/25/15 [redacted]w[redacted]d  2750 g F         Birth Comments: Cx change started at 19 wks     Complications: Incompetent cervix  3 IAB           2 SAB  [redacted]w[redacted]d            Birth Comments: Never went to doctor  1 SAB  [redacted]w[redacted]d            Birth Comments: never went to doctor   Past Surgical History:  Procedure Laterality Date   NO PAST SURGERIES     Family History  Problem Relation Age of Onset   Hypertension Mother    Cancer Neg Hx    Diabetes Neg Hx    Social History   Tobacco Use   Smoking status: Some Days    Packs/day: 0.25    Types: Cigarettes   Smokeless tobacco: Never  Vaping Use   Vaping Use: Never used  Substance Use Topics   Alcohol use: Not Currently   Drug use: Not Currently   Allergies  Allergen Reactions   Kiwi Extract Other (See Comments)    Numbness and tingling in  mouth when eating kiwi   No medications prior to admission.   I have reviewed patient's Past Medical Hx, Surgical Hx, Family Hx, Social Hx, medications and allergies.   ROS:  Review of Systems  Constitutional: Negative.   Respiratory: Negative.    Cardiovascular: Negative.   Gastrointestinal: Negative.        Tightness   Genitourinary:  Positive for vaginal bleeding and vaginal pain (pelvic pressure). Negative for vaginal discharge.  Musculoskeletal: Negative.   Neurological: Negative.   Psychiatric/Behavioral: Negative.     Physical Exam  Patient Vitals for the past 24 hrs:  BP Temp Temp src Pulse Resp SpO2  02/01/21 1541 (!) 113/57 -- -- 75 -- --  02/01/21 1303 116/82 99.1 F (37.3 C) Oral 80 17 100 %   Constitutional: well-developed, well-nourished female in no acute distress.  Cardiovascular: normal rate Respiratory: normal effort GI: abd soft, non-tender, gravid  MS: extremities nontender, no edema, normal ROM Neurologic:  alert and oriented x 4.  GU: neg CVAT. Pelvic: NEFG, small amount of dark red mucousy blood from os, no pooling of amniotic fluid, cervix clean without lesions/masses, no CMT  Dilation: 2.5 Effacement (%): 50 Station: -2 Presentation: Vertex Exam by:: Camelia Eng, CNM  FHT: Baseline 135 bpm, moderate variability, 15x15 accelerations present, no decelerations Toco: q 2-7 mins   Labs: Results for orders placed or performed during the hospital encounter of 02/01/21 (from the past 24 hour(s))  Urinalysis, Routine w reflex microscopic Urine, Clean Catch     Status: Abnormal   Collection Time: 02/01/21  1:21 PM  Result Value Ref Range   Color, Urine YELLOW YELLOW   APPearance HAZY (A) CLEAR   Specific Gravity, Urine 1.017 1.005 - 1.030   pH 6.0 5.0 - 8.0   Glucose, UA NEGATIVE NEGATIVE mg/dL   Hgb urine dipstick MODERATE (A) NEGATIVE   Bilirubin Urine NEGATIVE NEGATIVE   Ketones, ur NEGATIVE NEGATIVE mg/dL   Protein, ur NEGATIVE NEGATIVE  mg/dL   Nitrite NEGATIVE NEGATIVE   Leukocytes,Ua NEGATIVE NEGATIVE   RBC / HPF >50 (H) 0 - 5 RBC/hpf   WBC, UA 0-5 0 - 5 WBC/hpf   Bacteria, UA NONE SEEN NONE SEEN   Squamous Epithelial / LPF 6-10 0 - 5   Mucus PRESENT   Fern Test     Status: None   Collection Time: 02/01/21  3:42 PM  Result Value Ref Range   POCT Fern Test Negative = intact amniotic membranes     Imaging:    MAU Course: Orders Placed This Encounter  Procedures   [place] MFM OB LIMITED   Urinalysis, Routine w reflex microscopic Urine, Clean Catch   Fern Test   Discharge patient   No orders of the defined types were placed in this encounter.   MDM: Speculum exam with small amount of dark red mucousy bleeding. Cervix 2.5/50/-2, remains unchanged after almost 2 hours. Contractions irregular [place] ordered: anterior placenta, no evidence of previa or abruption, AFI wnl NST reactive and reassuring for gestational age Discussed patient with Dr. Charlotta Newton who agrees patient is stable for discharge home Reviewed strict return precautions  Assessment: 1. Supervision of other normal pregnancy, antepartum   2. Vaginal bleeding in pregnancy, third trimester     Plan: Discharge home in stable condition  Labor precautions and fetal kick counts   Follow-up Information     [facility] Follow up.   Specialty: Obstetrics and Gynecology Why: as scheduled. Return to MAU as needed. Contact information: [address] [place] [address] [phone]                Allergies as of 02/01/2021       Reactions   Kiwi Extract Other (See Comments)   Numbness and tingling in mouth when eating kiwi        Medication List     TAKE these medications    docusate sodium 100 MG capsule Commonly known as: COLACE Take 1 capsule (100 mg total) by mouth 2 (two) times daily as needed.   metroNIDAZOLE 500 MG tablet Commonly known as: FLAGYL Take 1  tablet (500 mg total) by mouth 2 (two) times daily.   multivitamin-prenatal 27-0.8 MG Tabs tablet Take 1 tablet by mouth daily at 12 noon.   Proctofoam HC rectal foam Generic drug: hydrocortisone-pramoxine Place 1 applicator rectally 2 (two) times daily.   progesterone 200 MG Supp Place 200  mg vaginally at bedtime.   terconazole 0.4 % vaginal cream Commonly known as: TERAZOL 7 Apply 1 applicator vaginally every night at bedtime for 3 days.   valACYclovir 500 MG tablet Commonly known as: VALTREX Take 1 tablet (500 mg total) by mouth 2 (two) times daily.        Camelia Eng, MSN, CNM 02/01/2021 5:26 PM

## 2021-02-04 ENCOUNTER — Ambulatory Visit: Payer: Medicaid Other | Attending: Obstetrics

## 2021-02-04 ENCOUNTER — Other Ambulatory Visit: Payer: Self-pay

## 2021-02-04 ENCOUNTER — Ambulatory Visit: Payer: Medicaid Other | Admitting: *Deleted

## 2021-02-04 ENCOUNTER — Encounter: Payer: Self-pay | Admitting: *Deleted

## 2021-02-04 VITALS — BP 106/62 | HR 88

## 2021-02-04 DIAGNOSIS — F17210 Nicotine dependence, cigarettes, uncomplicated: Secondary | ICD-10-CM | POA: Diagnosis not present

## 2021-02-04 DIAGNOSIS — O3433 Maternal care for cervical incompetence, third trimester: Secondary | ICD-10-CM | POA: Insufficient documentation

## 2021-02-04 DIAGNOSIS — O283 Abnormal ultrasonic finding on antenatal screening of mother: Secondary | ICD-10-CM | POA: Diagnosis not present

## 2021-02-04 DIAGNOSIS — Z3480 Encounter for supervision of other normal pregnancy, unspecified trimester: Secondary | ICD-10-CM | POA: Insufficient documentation

## 2021-02-04 DIAGNOSIS — O99333 Smoking (tobacco) complicating pregnancy, third trimester: Secondary | ICD-10-CM | POA: Insufficient documentation

## 2021-02-04 DIAGNOSIS — O09213 Supervision of pregnancy with history of pre-term labor, third trimester: Secondary | ICD-10-CM

## 2021-02-04 DIAGNOSIS — F17200 Nicotine dependence, unspecified, uncomplicated: Secondary | ICD-10-CM | POA: Insufficient documentation

## 2021-02-04 DIAGNOSIS — O35EXX0 Maternal care for other (suspected) fetal abnormality and damage, fetal genitourinary anomalies, not applicable or unspecified: Secondary | ICD-10-CM

## 2021-02-04 DIAGNOSIS — O403XX0 Polyhydramnios, third trimester, not applicable or unspecified: Secondary | ICD-10-CM | POA: Diagnosis not present

## 2021-02-04 DIAGNOSIS — O358XX0 Maternal care for other (suspected) fetal abnormality and damage, not applicable or unspecified: Secondary | ICD-10-CM | POA: Insufficient documentation

## 2021-02-04 DIAGNOSIS — Z3A37 37 weeks gestation of pregnancy: Secondary | ICD-10-CM | POA: Insufficient documentation

## 2021-02-04 DIAGNOSIS — O09293 Supervision of pregnancy with other poor reproductive or obstetric history, third trimester: Secondary | ICD-10-CM | POA: Diagnosis not present

## 2021-02-08 ENCOUNTER — Encounter: Payer: Self-pay | Admitting: Advanced Practice Midwife

## 2021-02-08 ENCOUNTER — Telehealth: Payer: Self-pay

## 2021-02-08 ENCOUNTER — Ambulatory Visit (INDEPENDENT_AMBULATORY_CARE_PROVIDER_SITE_OTHER): Payer: Medicaid Other | Admitting: Advanced Practice Midwife

## 2021-02-08 VITALS — BP 101/59 | HR 76 | Wt 130.0 lb

## 2021-02-08 DIAGNOSIS — O358XX0 Maternal care for other (suspected) fetal abnormality and damage, not applicable or unspecified: Secondary | ICD-10-CM

## 2021-02-08 DIAGNOSIS — Z3A38 38 weeks gestation of pregnancy: Secondary | ICD-10-CM

## 2021-02-08 DIAGNOSIS — Z3480 Encounter for supervision of other normal pregnancy, unspecified trimester: Secondary | ICD-10-CM

## 2021-02-08 DIAGNOSIS — O35EXX0 Maternal care for other (suspected) fetal abnormality and damage, fetal genitourinary anomalies, not applicable or unspecified: Secondary | ICD-10-CM

## 2021-02-08 DIAGNOSIS — O403XX0 Polyhydramnios, third trimester, not applicable or unspecified: Secondary | ICD-10-CM

## 2021-02-08 NOTE — Progress Notes (Signed)
   PRENATAL VISIT NOTE  Subjective:  Elizabeth [name] is a 25 y.o. D3U2025 at [redacted]w[redacted]d being seen today for ongoing prenatal care.  She is currently monitored for the following issues for this high-risk pregnancy and has Supervision of other normal pregnancy, antepartum; H/O incompetent cervix, currently pregnant; and HSV infection on their problem list.  Patient reports no complaints.  Contractions: Irregular. Vag. Bleeding: None.  Movement: Present. Denies leaking of fluid.   The following portions of the patient's history were reviewed and updated as appropriate: allergies, current medications, past family history, past medical history, past social history, past surgical history and problem list.   Objective:   Vitals:   02/08/21 1028  BP: (!) 101/59  Pulse: 76  Weight: 130 lb (59 kg)    Fetal Status: Fetal Heart Rate (bpm): 130   Movement: Present     General:  Alert, oriented and cooperative. Patient is in no acute distress.  Skin: Skin is warm and dry. No rash noted.   Cardiovascular: Normal heart rate noted  Respiratory: Normal respiratory effort, no problems with respiration noted  Abdomen: Soft, gravid, appropriate for gestational age.  Pain/Pressure: Present     Pelvic: Cervical exam deferred        Extremities: Normal range of motion.  Edema: None  Mental Status: Normal mood and affect. Normal behavior. Normal judgment and thought content.   Assessment and Plan:  Pregnancy: G5P0131 at [redacted]w[redacted]d 1. [redacted] weeks gestation of pregnancy   2. Supervision of other normal pregnancy, antepartum   3. Encounter for repeat ultrasound of fetal pyelectasis in singleton pregnancy, antepartum      Bilateral pyelectasisfollowed by MFM  4. Polyhydramnios in third trimester complication, single or unspecified fetus     MFM recommends delivery at 39 wks.   Term  labor symptoms and general obstetric precautions including but not limited to vaginal bleeding, contractions, leaking of fluid and  fetal movement were reviewed in detail with the patient. Please refer to After Visit Summary for other counseling recommendations.   Plan IOL next week then PP visit in 5 wks    Wynelle Bourgeois, CNM

## 2021-02-08 NOTE — Telephone Encounter (Signed)
Called pt to make her aware that her induction is scheduled for Thursday 8/4 in the morning. Left message for pt to call the office back. Jonny Dearden l Timothee Gali, CMA

## 2021-02-09 ENCOUNTER — Other Ambulatory Visit: Payer: Self-pay | Admitting: Advanced Practice Midwife

## 2021-02-10 ENCOUNTER — Inpatient Hospital Stay (HOSPITAL_COMMUNITY)
Admission: AD | Admit: 2021-02-10 | Discharge: 2021-02-11 | Disposition: A | Discharge status: Home / Self Care | Payer: Medicaid Other | Attending: Family Medicine | Admitting: Family Medicine

## 2021-02-10 ENCOUNTER — Encounter (HOSPITAL_COMMUNITY): Payer: Self-pay | Admitting: Family Medicine

## 2021-02-10 ENCOUNTER — Telehealth (HOSPITAL_COMMUNITY): Payer: Self-pay | Admitting: *Deleted

## 2021-02-10 DIAGNOSIS — Z3480 Encounter for supervision of other normal pregnancy, unspecified trimester: Secondary | ICD-10-CM

## 2021-02-10 DIAGNOSIS — O479 False labor, unspecified: Secondary | ICD-10-CM

## 2021-02-10 DIAGNOSIS — O471 False labor at or after 37 completed weeks of gestation: Secondary | ICD-10-CM | POA: Insufficient documentation

## 2021-02-10 DIAGNOSIS — Z3689 Encounter for other specified antenatal screening: Secondary | ICD-10-CM

## 2021-02-10 DIAGNOSIS — Z3A38 38 weeks gestation of pregnancy: Secondary | ICD-10-CM | POA: Insufficient documentation

## 2021-02-10 NOTE — Telephone Encounter (Signed)
Preadmission screen  

## 2021-02-10 NOTE — MAU Note (Signed)
Ctxs since yesterday that were not painful but tonight ctxs are painful. Denies VB or LOF. 2.5cm last sve

## 2021-02-11 ENCOUNTER — Telehealth (HOSPITAL_COMMUNITY): Payer: Self-pay | Admitting: *Deleted

## 2021-02-11 ENCOUNTER — Encounter (HOSPITAL_COMMUNITY): Payer: Self-pay | Admitting: *Deleted

## 2021-02-11 ENCOUNTER — Other Ambulatory Visit (HOSPITAL_BASED_OUTPATIENT_CLINIC_OR_DEPARTMENT_OTHER): Payer: Self-pay

## 2021-02-11 DIAGNOSIS — O471 False labor at or after 37 completed weeks of gestation: Secondary | ICD-10-CM | POA: Diagnosis not present

## 2021-02-11 DIAGNOSIS — Z3A38 38 weeks gestation of pregnancy: Secondary | ICD-10-CM

## 2021-02-11 MED ORDER — PRENATAL 27-0.8 MG PO TABS
1.0000 | ORAL_TABLET | Freq: Every day | ORAL | 0 refills | Status: DC
  Filled 2021-02-11: qty 30, 30d supply, fill #0

## 2021-02-11 NOTE — Telephone Encounter (Signed)
Preadmission screen  

## 2021-02-11 NOTE — MAU Provider Note (Signed)
  S: [name] is a 25 y.o. [id number] at [redacted]w[redacted]d  who presents to MAU today complaining contractions since 02/09/2021 with increasing intensity this evening. Patient denies vaginal bleeding, leaking of fluid, decreased fetal movement, fever, falls, or recent illness.    O: BP (!) 108/59   Pulse 77   Temp 98.2 F (36.8 C)   Resp 18   Ht 5\' 4"  (1.626 m)   Wt 60.3 kg   BMI 22.83 kg/m  GENERAL: Well-developed, well-nourished female in no acute distress.  HEAD: Normocephalic, atraumatic.  CHEST: Normal effort of breathing, regular heart rate ABDOMEN: Soft, nontender, gravid  Cervical exam:  Dilation: 2.5 Effacement (%): 50 Station: -2 Presentation: Vertex Exam by:: Alondra Sedano RN   Fetal Monitoring: Baseline: 125 Variability: Mod Accelerations: 15 x 15 Decelerations: N/A Contractions: Irregular q 2-9 min   A: SIUP at [redacted]w[redacted]d  2.5 cm in office Negligible change in exam s/p 90 min of evaluation Cat I tracing Normotensive  P: Discharge home in stable condition  [redacted]w[redacted]d, CNM 02/11/2021 1:20 AM

## 2021-02-11 NOTE — MAU Note (Signed)
I have communicated with Clayton Bibles, CNM and reviewed vital signs:  Vitals:   02/10/21 2302 02/10/21 2304  BP:  (!) 108/59  Pulse:  77  Resp: 18   Temp: 98.2 F (36.8 C)     Vaginal exam:  Dilation: 3 Effacement (%): 80 Station: -2 Presentation: Vertex Exam by:: Manuela Neptune, RN,   Also reviewed contraction pattern and that non-stress test is reactive.  It has been documented that patient is contracting irregularly with minimal cervical change over 1.5 hours not indicating active labor.  Patient denies any other complaints.  Based on this report provider has given order for discharge.  A discharge order and diagnosis entered by a provider.   Labor discharge instructions reviewed with patient.

## 2021-02-13 ENCOUNTER — Inpatient Hospital Stay (HOSPITAL_COMMUNITY)
Admission: AD | Admit: 2021-02-13 | Discharge: 2021-02-13 | Disposition: A | Discharge status: Home / Self Care | Payer: Medicaid Other | Attending: Obstetrics & Gynecology | Admitting: Obstetrics & Gynecology

## 2021-02-13 ENCOUNTER — Encounter (HOSPITAL_COMMUNITY): Payer: Self-pay | Admitting: Obstetrics & Gynecology

## 2021-02-13 ENCOUNTER — Other Ambulatory Visit: Payer: Self-pay

## 2021-02-13 DIAGNOSIS — O368130 Decreased fetal movements, third trimester, not applicable or unspecified: Secondary | ICD-10-CM | POA: Diagnosis not present

## 2021-02-13 DIAGNOSIS — O99333 Smoking (tobacco) complicating pregnancy, third trimester: Secondary | ICD-10-CM | POA: Diagnosis not present

## 2021-02-13 DIAGNOSIS — F17210 Nicotine dependence, cigarettes, uncomplicated: Secondary | ICD-10-CM | POA: Insufficient documentation

## 2021-02-13 DIAGNOSIS — O368120 Decreased fetal movements, second trimester, not applicable or unspecified: Secondary | ICD-10-CM

## 2021-02-13 DIAGNOSIS — Z3A38 38 weeks gestation of pregnancy: Secondary | ICD-10-CM | POA: Diagnosis not present

## 2021-02-13 DIAGNOSIS — Z3689 Encounter for other specified antenatal screening: Secondary | ICD-10-CM

## 2021-02-13 NOTE — MAU Provider Note (Signed)
History     CSN: [id number]  Arrival date and time: 02/13/21 0017   Event Date/Time   First Provider Initiated Contact with Patient 02/13/21 0132      Chief Complaint  Patient presents with   Decreased Fetal Movement    Pt reported movement while walking to room before-she had only felt 2 movements since 745pm   Elizabeth [name] is a 24 y.o. [id number] at [redacted]w[redacted]d who receives care at [facility].  She presents today for Decreased Fetal Movement.  She states she had not felt movement since 745pm. She states she ate dinner and still did not feel movement.  She states she then ate candy/snack and only felt about 2 movements.  She states upon arrival fetal movement increased significantly.  She denies vaginal bleeding or leaking, but reports some mucoid discharge.     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    pt reports last outbreak years ago   Vaginal Pap smear, abnormal     Past Surgical History:  Procedure Laterality Date   NO PAST SURGERIES      Family History  Problem Relation Age of Onset   Hypertension Mother    Cancer Neg Hx    Diabetes Neg Hx     Social History   Tobacco Use   Smoking status: Every Day    Packs/day: 0.25    Types: Cigarettes   Smokeless tobacco: Never  Vaping Use   Vaping Use: Never used  Substance Use Topics   Alcohol use: Not Currently   Drug use: Not Currently    Allergies:  Allergies  Allergen Reactions   Kiwi Extract Other (See Comments)    Numbness and tingling in mouth when eating kiwi    Medications Prior to Admission  Medication Sig Dispense Refill Last Dose   valACYclovir (VALTREX) 500 MG tablet Take 1 tablet (500 mg total) by mouth 2 (two) times daily. 30 tablet 6 Past Week    docusate sodium (COLACE) 100 MG capsule Take 1 capsule (100 mg total) by mouth 2 (two) times daily as needed. (Patient not taking: No sig reported) 100 capsule 2    hydrocortisone-pramoxine (PROCTOFOAM HC) rectal foam Place 1 applicator rectally 2 (two) times daily. (Patient not taking: No sig reported) 10 g 3    metroNIDAZOLE (FLAGYL) 500 MG tablet Take 1 tablet (500 mg total) by mouth 2 (two) times daily. 14 tablet 0    Prenatal Vit-Fe Fumarate-FA (MULTIVITAMIN-PRENATAL) 27-0.8 MG TABS tablet Take 1 tablet by mouth daily at 12 noon. 30 tablet 0    progesterone 200 MG SUPP Place 200 mg vaginally at bedtime.      terconazole (TERAZOL 7) 0.4 % vaginal cream Apply 1 applicator vaginally every night at bedtime for 3 days. 45 g 0     Review of Systems  Constitutional:  Negative for chills and fever.  Eyes:  Negative for visual disturbance.  Gastrointestinal:  Negative for abdominal pain, nausea and vomiting.  Genitourinary:  Positive for  pelvic pain (Throbbing). Negative for difficulty urinating, dysuria, vaginal bleeding and vaginal discharge.  Musculoskeletal:  Positive for back pain.  Neurological:  Negative for dizziness, light-headedness and headaches.  Physical Exam   Blood pressure 109/64, pulse 81, temperature 97.8 F (36.6 C), temperature source Oral, resp. rate 17, height 5\' 4"  (1.626 m), weight 60.4 kg, SpO2 100 %.  Physical Exam Constitutional:      Appearance: Normal appearance.  HENT:     Head: Normocephalic and atraumatic.     Mouth/Throat:     Pharynx: Oropharynx is clear.  Eyes:     Conjunctiva/sclera: Conjunctivae normal.  Cardiovascular:     Rate and Rhythm: Normal rate.  Pulmonary:     Effort: Pulmonary effort is normal.  Abdominal:     Comments: Gravid  Genitourinary:    Comments: Dilation: 3 Effacement (%): 80 Cervical Position: Middle Station: -2 Presentation: Brow Exam by:: J.Rosetta Rupnow, CNM  Skin:    General: Skin is warm and dry.  Neurological:     Mental  Status: She is alert and oriented to person, place, and time.  Psychiatric:        Mood and Affect: Mood normal.        Thought Content: Thought content normal.    Fetal Assessment 125 bpm, Mod Var, -Decels, +Accels Toco: Q3-6 minutes  MAU Course  No results found for this or any previous visit (from the past 24 hour(s)). No results found.  MDM PE Labs: None EFM  Assessment and Plan  25 year old G5P0131  SIUP at 38.6 weeks Cat I FT DFM   -Informed that since movement now perceived and NST reactive will plan to discharge. -Patient requests cervical exam. -Informed that no change in exam from previous. -Precautions given.  -Encouraged to call or return to MAU if symptoms worsen or with the onset of new symptoms. -Discharged to home in stable condition. -Patient to return to hospital on Thursday for scheduled induction.  Wednesday MSN, CNM 02/13/2021, 1:32 AM

## 2021-02-13 NOTE — MAU Note (Signed)
Pt reports decreased fetal movement this evening beginning at 745pm-ate dinner-no movement in 2 hours-had candy for snack and had only 2 movements in the next 2 hours and decided to come in for evaluation. Pt denies SROM, vaginal bleeding or bloody show. Denies regular or painful contractions or cramping.

## 2021-02-14 ENCOUNTER — Other Ambulatory Visit (HOSPITAL_BASED_OUTPATIENT_CLINIC_OR_DEPARTMENT_OTHER): Payer: Self-pay

## 2021-02-14 DIAGNOSIS — Z419 Encounter for procedure for purposes other than remedying health state, unspecified: Secondary | ICD-10-CM | POA: Diagnosis not present

## 2021-02-15 ENCOUNTER — Other Ambulatory Visit: Payer: Self-pay | Admitting: Obstetrics & Gynecology

## 2021-02-15 ENCOUNTER — Other Ambulatory Visit: Payer: Self-pay

## 2021-02-15 ENCOUNTER — Ambulatory Visit (INDEPENDENT_AMBULATORY_CARE_PROVIDER_SITE_OTHER): Payer: Medicaid Other

## 2021-02-15 VITALS — BP 110/56 | HR 66 | Wt 131.0 lb

## 2021-02-15 DIAGNOSIS — Z3480 Encounter for supervision of other normal pregnancy, unspecified trimester: Secondary | ICD-10-CM | POA: Diagnosis not present

## 2021-02-15 DIAGNOSIS — Z8619 Personal history of other infectious and parasitic diseases: Secondary | ICD-10-CM | POA: Diagnosis not present

## 2021-02-15 DIAGNOSIS — Z3A39 39 weeks gestation of pregnancy: Secondary | ICD-10-CM | POA: Diagnosis not present

## 2021-02-15 LAB — SARS CORONAVIRUS 2 (TAT 6-24 HRS): SARS Coronavirus 2: NEGATIVE

## 2021-02-15 NOTE — Progress Notes (Signed)
   LOW-RISK PREGNANCY OFFICE VISIT  Patient name: Elizabeth [name] MRN [id number]  Date of birth: 12/01/1995 Chief Complaint:   Routine Prenatal Visit  Subjective:   Elizabeth [name] is a 25 y.o. U4Q0347 female at [redacted]w[redacted]d with an Estimated Date of Delivery: 02/21/21 being seen today for ongoing management of a low-risk pregnancy aeb has Supervision of other normal pregnancy, antepartum; H/O incompetent cervix, currently pregnant; and HSV infection on their problem list.  Patient presents today with no complaints.  Patient endorses fetal movement. Patient denies abdominal cramping or contractions.  Patient denies vaginal concerns including abnormal discharge, leaking of fluid, and bleeding.  Contractions: Irregular. Vag. Bleeding: Other.  Movement: Present.  Reviewed past medical,surgical, social, obstetrical and family history as well as problem list, medications and allergies.  Objective   Vitals:   02/15/21 1041  BP: (!) 110/56  Pulse: 66  Weight: 131 lb (59.4 kg)  Body mass index is 22.49 kg/m.  Total Weight Gain:31 lb (14.1 kg)         Physical Examination:   General appearance: Well appearing, and in no distress  Mental status: Alert, oriented to person, place, and time  Skin: Warm & dry  Cardiovascular: Normal heart rate noted  Respiratory: Normal respiratory effort, no distress  Abdomen: Soft, gravid, nontender, AGA with Fundal Height: 38 cm  Pelvic: Cervical exam performed  Dilation: 3 Effacement (%): 50 Station: -2 Presentation: Vertex  Speculum Exam: No lesions noted on external genitalia. Small area of erythema at introitus-Non tender. Small amt thick white discharge. No lesions or blisters noted within vaginal vault or on cervix.   Extremities: Edema: None  Fetal Status: Fetal Heart Rate (bpm): 125  Movement: Present   No results found for this or any previous visit (from the past 24 hour(s)).  Assessment & Plan:  Low-risk pregnancy of a 24 y.o., [id number] at [redacted]w[redacted]d with  an Estimated Date of Delivery: 02/21/21   1. Supervision of other normal pregnancy, antepartum -Anticipatory guidance for upcoming appts. -Patient to schedule next appt in 4-5 weeks for a Postpartum visit.  2. History of herpes genitalis -Exam performed and negative. -Patient informed of findings.  -Continue Valtrex as prescribed.   3. [redacted] weeks gestation of pregnancy -Doing well. -Requests cervical exam and membrane stripping. -Reviewed risks and benefits of membrane stripping including: *Abdominal cramping, contractions, ROM, and vaginal bleeding.  *Discussed how this is considered induction method as it causes releasing of prostaglandins which can stimulate labor resulting in delivery.  *Cautioned that membrane stripping/sweeping is not guaranteed to cause labor onset. -Patient verbalized understanding and wishes to proceed with procedure.  -VE and membrane stripping performed; Patient tolerated well. -Encouraged to go home and engage in sexual activity if possible.    Meds: No orders of the defined types were placed in this encounter.  Labs/procedures today:  Lab Orders  No laboratory test(s) ordered today     Reviewed: Term labor symptoms and general obstetric precautions including but not limited to vaginal bleeding, contractions, leaking of fluid and fetal movement were reviewed in detail with the patient.  All questions were answered.  Follow-up: Return in about 5 weeks (around 03/22/2021) for Postpartum Visit.  No orders of the defined types were placed in this encounter.  Cherre Robins MSN, CNM 02/15/2021

## 2021-02-15 NOTE — Progress Notes (Signed)
Patient is scheduled for induction on Thursday 02/17/2021. Armandina Stammer RN

## 2021-02-16 ENCOUNTER — Encounter (HOSPITAL_COMMUNITY): Payer: Self-pay | Admitting: Obstetrics and Gynecology

## 2021-02-16 ENCOUNTER — Inpatient Hospital Stay (HOSPITAL_COMMUNITY): Payer: Medicaid Other | Admitting: Anesthesiology

## 2021-02-16 ENCOUNTER — Inpatient Hospital Stay (HOSPITAL_COMMUNITY)
Admission: AD | Admit: 2021-02-16 | Discharge: 2021-02-18 | DRG: 807 | Disposition: A | Discharge status: Home / Self Care | Payer: Medicaid Other | Attending: Obstetrics & Gynecology | Admitting: Obstetrics & Gynecology

## 2021-02-16 DIAGNOSIS — O99334 Smoking (tobacco) complicating childbirth: Secondary | ICD-10-CM | POA: Diagnosis not present

## 2021-02-16 DIAGNOSIS — O4202 Full-term premature rupture of membranes, onset of labor within 24 hours of rupture: Secondary | ICD-10-CM | POA: Diagnosis not present

## 2021-02-16 DIAGNOSIS — O358XX0 Maternal care for other (suspected) fetal abnormality and damage, not applicable or unspecified: Secondary | ICD-10-CM | POA: Diagnosis not present

## 2021-02-16 DIAGNOSIS — Z3A39 39 weeks gestation of pregnancy: Secondary | ICD-10-CM

## 2021-02-16 DIAGNOSIS — O09299 Supervision of pregnancy with other poor reproductive or obstetric history, unspecified trimester: Secondary | ICD-10-CM

## 2021-02-16 DIAGNOSIS — Z23 Encounter for immunization: Secondary | ICD-10-CM

## 2021-02-16 DIAGNOSIS — O9852 Other viral diseases complicating childbirth: Secondary | ICD-10-CM | POA: Diagnosis not present

## 2021-02-16 DIAGNOSIS — B009 Herpesviral infection, unspecified: Secondary | ICD-10-CM | POA: Diagnosis present

## 2021-02-16 DIAGNOSIS — O403XX0 Polyhydramnios, third trimester, not applicable or unspecified: Secondary | ICD-10-CM | POA: Diagnosis present

## 2021-02-16 DIAGNOSIS — O26893 Other specified pregnancy related conditions, third trimester: Secondary | ICD-10-CM | POA: Diagnosis not present

## 2021-02-16 DIAGNOSIS — F17210 Nicotine dependence, cigarettes, uncomplicated: Secondary | ICD-10-CM | POA: Diagnosis present

## 2021-02-16 DIAGNOSIS — O9832 Other infections with a predominantly sexual mode of transmission complicating childbirth: Secondary | ICD-10-CM | POA: Diagnosis not present

## 2021-02-16 DIAGNOSIS — A6000 Herpesviral infection of urogenital system, unspecified: Secondary | ICD-10-CM | POA: Diagnosis present

## 2021-02-16 DIAGNOSIS — O403XX1 Polyhydramnios, third trimester, fetus 1: Secondary | ICD-10-CM | POA: Diagnosis not present

## 2021-02-16 LAB — CBC
HCT: 35.5 % — ABNORMAL LOW (ref 36.0–46.0)
Hemoglobin: 11.9 g/dL — ABNORMAL LOW (ref 12.0–15.0)
MCH: 29.8 pg (ref 26.0–34.0)
MCHC: 33.5 g/dL (ref 30.0–36.0)
MCV: 89 fL (ref 80.0–100.0)
Platelets: 171 10*3/uL (ref 150–400)
RBC: 3.99 MIL/uL (ref 3.87–5.11)
RDW: 13.3 % (ref 11.5–15.5)
WBC: 13.3 10*3/uL — ABNORMAL HIGH (ref 4.0–10.5)
nRBC: 0 % (ref 0.0–0.2)

## 2021-02-16 LAB — POCT FERN TEST
POCT Fern Test: NEGATIVE
POCT Fern Test: POSITIVE

## 2021-02-16 LAB — TYPE AND SCREEN
ABO/RH(D): A POS
Antibody Screen: NEGATIVE

## 2021-02-16 LAB — RPR: RPR Ser Ql: NONREACTIVE

## 2021-02-16 MED ORDER — EPHEDRINE 5 MG/ML INJ: 10.0000 mg | INTRAVENOUS | Status: DC | PRN

## 2021-02-16 MED ORDER — ACETAMINOPHEN 325 MG PO TABS
650.0000 mg | ORAL_TABLET | ORAL | Status: DC | PRN
Start: 2021-02-16 — End: 2021-02-16

## 2021-02-16 MED ORDER — DIBUCAINE (PERIANAL) 1 % EX OINT: 1.0000 "application " | TOPICAL_OINTMENT | CUTANEOUS | Status: DC | PRN

## 2021-02-16 MED ORDER — ONDANSETRON HCL 4 MG/2ML IJ SOLN
4.0000 mg | Freq: Four times a day (QID) | INTRAMUSCULAR | Status: DC | PRN
Start: 2021-02-16 — End: 2021-02-16
  Administered 2021-02-16: 4 mg via INTRAVENOUS
  Filled 2021-02-16: qty 2

## 2021-02-16 MED ORDER — PRENATAL MULTIVITAMIN CH
1.0000 | ORAL_TABLET | Freq: Every day | ORAL | Status: DC
  Administered 2021-02-16 – 2021-02-18 (×3): 1 via ORAL
  Filled 2021-02-16 (×3): qty 1

## 2021-02-16 MED ORDER — SODIUM CHLORIDE 0.9% FLUSH
3.0000 mL | Freq: Two times a day (BID) | INTRAVENOUS | Status: DC
  Administered 2021-02-16: 3 mL via INTRAVENOUS

## 2021-02-16 MED ORDER — ZOLPIDEM TARTRATE 5 MG PO TABS: 5.0000 mg | ORAL_TABLET | Freq: Every evening | ORAL | Status: DC | PRN

## 2021-02-16 MED ORDER — SODIUM CHLORIDE 0.9 % IV SOLN: 250.0000 mL | INTRAVENOUS | Status: DC | PRN

## 2021-02-16 MED ORDER — PROMETHAZINE HCL 25 MG/ML IJ SOLN
12.5000 mg | Freq: Once | INTRAMUSCULAR | Status: AC
  Administered 2021-02-16: 12.5 mg via INTRAMUSCULAR
  Filled 2021-02-16: qty 1

## 2021-02-16 MED ORDER — LACTATED RINGERS IV SOLN: 500.0000 mL | Freq: Once | INTRAVENOUS | Status: DC

## 2021-02-16 MED ORDER — DIPHENHYDRAMINE HCL 50 MG/ML IJ SOLN: 12.5000 mg | INTRAMUSCULAR | Status: DC | PRN

## 2021-02-16 MED ORDER — ACETAMINOPHEN 325 MG PO TABS
650.0000 mg | ORAL_TABLET | ORAL | Status: DC | PRN
  Administered 2021-02-16 – 2021-02-18 (×4): 650 mg via ORAL
  Filled 2021-02-16 (×4): qty 2

## 2021-02-16 MED ORDER — BUTORPHANOL TARTRATE 1 MG/ML IJ SOLN
1.0000 mg | Freq: Once | INTRAMUSCULAR | Status: AC
  Administered 2021-02-16: 1 mg via INTRAMUSCULAR
  Filled 2021-02-16: qty 1

## 2021-02-16 MED ORDER — COCONUT OIL OIL
1.0000 "application " | TOPICAL_OIL | Status: DC | PRN
  Administered 2021-02-17: 1 via TOPICAL

## 2021-02-16 MED ORDER — BENZOCAINE-MENTHOL 20-0.5 % EX AERO: 1.0000 "application " | INHALATION_SPRAY | CUTANEOUS | Status: DC | PRN

## 2021-02-16 MED ORDER — SOD CITRATE-CITRIC ACID 500-334 MG/5ML PO SOLN: 30.0000 mL | ORAL | Status: DC | PRN

## 2021-02-16 MED ORDER — SENNOSIDES-DOCUSATE SODIUM 8.6-50 MG PO TABS
2.0000 | ORAL_TABLET | ORAL | Status: DC
  Administered 2021-02-16 – 2021-02-18 (×3): 2 via ORAL
  Filled 2021-02-16 (×2): qty 2

## 2021-02-16 MED ORDER — OXYCODONE-ACETAMINOPHEN 5-325 MG PO TABS
2.0000 | ORAL_TABLET | ORAL | Status: DC | PRN
Start: 2021-02-16 — End: 2021-02-16

## 2021-02-16 MED ORDER — MEASLES, MUMPS & RUBELLA VAC IJ SOLR: 0.5000 mL | Freq: Once | INTRAMUSCULAR | Status: DC

## 2021-02-16 MED ORDER — LACTATED RINGERS IV SOLN: 500.0000 mL | INTRAVENOUS | Status: DC | PRN

## 2021-02-16 MED ORDER — WITCH HAZEL-GLYCERIN EX PADS: 1.0000 "application " | MEDICATED_PAD | CUTANEOUS | Status: DC | PRN

## 2021-02-16 MED ORDER — OXYTOCIN-SODIUM CHLORIDE 30-0.9 UT/500ML-% IV SOLN
2.5000 [IU]/h | INTRAVENOUS | Status: DC
  Filled 2021-02-16: qty 500

## 2021-02-16 MED ORDER — FENTANYL-BUPIVACAINE-NACL 0.5-0.125-0.9 MG/250ML-% EP SOLN
12.0000 mL/h | EPIDURAL | Status: DC | PRN
Start: 2021-02-16 — End: 2021-02-16
  Administered 2021-02-16: 12 mL/h via EPIDURAL

## 2021-02-16 MED ORDER — LIDOCAINE HCL (PF) 1 % IJ SOLN: 30.0000 mL | INTRAMUSCULAR | Status: DC | PRN

## 2021-02-16 MED ORDER — TETANUS-DIPHTH-ACELL PERTUSSIS 5-2.5-18.5 LF-MCG/0.5 IM SUSY
0.5000 mL | PREFILLED_SYRINGE | Freq: Once | INTRAMUSCULAR | Status: AC
  Administered 2021-02-18: 0.5 mL via INTRAMUSCULAR
  Filled 2021-02-16: qty 0.5

## 2021-02-16 MED ORDER — LACTATED RINGERS IV SOLN: INTRAVENOUS | Status: DC

## 2021-02-16 MED ORDER — OXYTOCIN BOLUS FROM INFUSION
333.0000 mL | Freq: Once | INTRAVENOUS | Status: AC
  Administered 2021-02-16: 333 mL via INTRAVENOUS

## 2021-02-16 MED ORDER — IBUPROFEN 600 MG PO TABS
600.0000 mg | ORAL_TABLET | Freq: Four times a day (QID) | ORAL | Status: DC
  Administered 2021-02-16 – 2021-02-18 (×9): 600 mg via ORAL
  Filled 2021-02-16 (×9): qty 1

## 2021-02-16 MED ORDER — PHENYLEPHRINE 40 MCG/ML (10ML) SYRINGE FOR IV PUSH (FOR BLOOD PRESSURE SUPPORT): 80.0000 ug | PREFILLED_SYRINGE | INTRAVENOUS | Status: DC | PRN

## 2021-02-16 MED ORDER — FENTANYL-BUPIVACAINE-NACL 0.5-0.125-0.9 MG/250ML-% EP SOLN
EPIDURAL | Status: AC
  Filled 2021-02-16: qty 250

## 2021-02-16 MED ORDER — DIPHENHYDRAMINE HCL 25 MG PO CAPS: 25.0000 mg | ORAL_CAPSULE | Freq: Four times a day (QID) | ORAL | Status: DC | PRN

## 2021-02-16 MED ORDER — ONDANSETRON HCL 4 MG PO TABS: 4.0000 mg | ORAL_TABLET | ORAL | Status: DC | PRN

## 2021-02-16 MED ORDER — OXYCODONE-ACETAMINOPHEN 5-325 MG PO TABS
1.0000 | ORAL_TABLET | ORAL | Status: DC | PRN
Start: 2021-02-16 — End: 2021-02-16

## 2021-02-16 MED ORDER — SIMETHICONE 80 MG PO CHEW: 80.0000 mg | CHEWABLE_TABLET | ORAL | Status: DC | PRN

## 2021-02-16 MED ORDER — ONDANSETRON HCL 4 MG/2ML IJ SOLN: 4.0000 mg | INTRAMUSCULAR | Status: DC | PRN

## 2021-02-16 MED ORDER — SODIUM CHLORIDE 0.9% FLUSH: 3.0000 mL | INTRAVENOUS | Status: DC | PRN

## 2021-02-16 MED ORDER — LIDOCAINE HCL (PF) 1 % IJ SOLN
INTRAMUSCULAR | Status: DC | PRN
  Administered 2021-02-16: 10 mL via EPIDURAL

## 2021-02-16 NOTE — Anesthesia Preprocedure Evaluation (Signed)
Anesthesia Evaluation  Patient identified by MRN, date of birth, ID band Patient awake    Reviewed: Allergy & Precautions, H&P , Patient's Chart, lab work & pertinent test results  Airway Mallampati: I       Dental no notable dental hx.    Pulmonary Current Smoker,    Pulmonary exam normal        Cardiovascular negative cardio ROS Normal cardiovascular exam     Neuro/Psych negative neurological ROS  negative psych ROS   GI/Hepatic negative GI ROS, Neg liver ROS,   Endo/Other  negative endocrine ROS  Renal/GU negative Renal ROS  negative genitourinary   Musculoskeletal negative musculoskeletal ROS (+)   Abdominal Normal abdominal exam  (+)   Peds  Hematology negative hematology ROS (+)   Anesthesia Other Findings   Reproductive/Obstetrics (+) Pregnancy                             Anesthesia Physical Anesthesia Plan  ASA: 2  Anesthesia Plan: Epidural   Post-op Pain Management:    Induction:   PONV Risk Score and Plan:   Airway Management Planned:   Additional Equipment:   Intra-op Plan:   Post-operative Plan:   Informed Consent: I have reviewed the patients History and Physical, chart, labs and discussed the procedure including the risks, benefits and alternatives for the proposed anesthesia with the patient or authorized representative who has indicated his/her understanding and acceptance.       Plan Discussed with:   Anesthesia Plan Comments:         Anesthesia Quick Evaluation

## 2021-02-16 NOTE — Lactation Note (Addendum)
This note was copied from a baby's chart. Lactation Consultation Note  Patient Name: Elizabeth [name] [id number] Date: 02/16/2021 Reason for consult: Initial assessment;Mother's request;Term Age:26 hours  Mom stated feeding plan to work on breastfeeding and offering her EBM for supplementation after latching at the breast.   LPTI guidelines reviewed.  Mom flange size accessed as 21. Mother states comfortable fit. Mom understands nipple size can change and need to adjust accordingly.   Infant fed at breast just prior to [facility] arrival and resting comfortably. Mom to call for latch assistance with next feeding.   Mom pumped getting only drops of colostrum.  Mom attempted to formula feed after latching but infant did not take the bottle according to mother.   Plan 1. To feed based on cues 8-12x in 24 hr period no more than 3 hrs without a feeding.  2. Mom to hand express and offer drops of colostrum via spoon if unable to get infant to latch.  3. DEBP q 3hrs for 14 min  4. I and O sheet reviewed.  5 LC brochure of inpatient and outpatient services reviewed.  All questions answered at the end of the visit.   Maternal Data Has patient been taught Hand Expression?: Yes  Feeding Mother's Current Feeding Choice: Breast Milk  LATCH Score                    Lactation Tools Discussed/Used Tools: Pump;Flanges Flange Size: 21 Breast pump type: Double-Electric Breast Pump Pump Education: Setup, frequency, and cleaning;Milk Storage Reason for Pumping: increase stimulation Pumping frequency: every 3 hrs for 15 min  Interventions Interventions: Breast feeding basics reviewed;Skin to skin;DEBP;Breast compression;Breast massage;Position options;Hand express;Expressed milk;Education  Discharge Pump: Manual WIC Program: Yes  Consult Status Consult Status: Follow-up Date: 02/17/21 Follow-up type: In-patient    Elizabeth Sia  [name] 02/16/2021, 4:28 PM

## 2021-02-16 NOTE — Anesthesia Procedure Notes (Signed)
Epidural Patient location during procedure: OB Start time: 02/16/2021 5:36 AM End time: 02/16/2021 5:40 AM  Staffing Anesthesiologist: Leilani Able, MD Performed: anesthesiologist   Preanesthetic Checklist Completed: patient identified, IV checked, site marked, risks and benefits discussed, surgical consent, monitors and equipment checked, pre-op evaluation and timeout performed  Epidural Patient position: sitting Prep: DuraPrep and site prepped and draped Patient monitoring: continuous pulse ox and blood pressure Approach: midline Location: L3-L4 Injection technique: LOR air  Needle:  Needle type: Tuohy  Needle gauge: 17 G Needle length: 9 cm and 9 Needle insertion depth: 5 cm and 3 cm Catheter type: closed end flexible Catheter size: 19 Gauge Catheter at skin depth: 8 cm Test dose: negative and Other  Assessment Events: blood not aspirated, injection not painful, no injection resistance, no paresthesia and negative IV test  Additional Notes Reason for block:procedure for pain

## 2021-02-16 NOTE — Discharge Summary (Addendum)
Postpartum Discharge Summary    Patient Name: Elizabeth [name] DOB: June 15, 1996 MRN: [id number]  Date of admission: 02/16/2021 Delivery date:02/16/2021  Delivering provider: Patriciaann Clan  Date of discharge: 02/18/2021  Admitting diagnosis: Polyhydramnios affecting pregnancy in third trimester [O40.3XX0] Intrauterine pregnancy: [redacted]w[redacted]d     Secondary diagnosis:  Principal Problem:   Vaginal delivery Active Problems:   H/O incompetent cervix, currently pregnant   HSV infection   Polyhydramnios affecting pregnancy in third trimester  Additional problems: None     Discharge diagnosis: Term Pregnancy Delivered                                              Post partum procedures: None Augmentation: N/A Complications: None  Hospital course: Onset of Labor With Vaginal Delivery      25 y.o. yo [phone] at [redacted]w[redacted]d was admitted in Latent Labor on 02/16/2021. Patient had an uncomplicated labor course as follows:  Membrane Rupture Time/Date: 3:46 AM ,02/16/2021   Delivery Method:Vaginal, Spontaneous  Episiotomy: None  Lacerations:  1st degree  Patient had an uncomplicated postpartum course.  She is ambulating, tolerating a regular diet, passing flatus, and urinating well. Patient is discharged home in stable condition on 02/18/21.  Newborn Data: Birth date:02/16/2021  Birth time:9:13 AM  Gender:Female  Living status:Living  Apgars:9 ,9  Weight:2780 g   Magnesium Sulfate received: No BMZ received: No Rhophylac:No MMR:N/A T-DaP:Given prenatally Flu: N/A Transfusion:No  Physical exam  Vitals:   02/17/21 0552 02/17/21 1320 02/17/21 2048 02/18/21 0530  BP: (!) 91/52 (!) 96/56 (!) 79/68 101/69  Pulse: 63 77 75 71  Resp: $Remo'18 18 18 16  'Xmlay$ Temp: 98.4 F (36.9 C) 98.6 F (37 C) 98.6 F (37 C) 98.4 F (36.9 C)  TempSrc: Oral Oral Oral Oral  SpO2: 98% 100% 100% 100%  Weight:      Height:       General: alert, cooperative, and no distress Lochia: appropriate Uterine Fundus: firm Incision:  N/A DVT Evaluation: no evidence of DVT seen on physical exam  Labs: Lab Results  Component Value Date   WBC 13.3 (H) 02/16/2021   HGB 11.9 (L) 02/16/2021   HCT 35.5 (L) 02/16/2021   MCV 89.0 02/16/2021   PLT 171 02/16/2021   No flowsheet data found. Edinburgh Score: Edinburgh Postnatal Depression Scale Screening Tool 02/18/2021  I have been able to laugh and see the funny side of things. 0  I have looked forward with enjoyment to things. 0  I have blamed myself unnecessarily when things went wrong. 1  I have been anxious or worried for no good reason. 0  I have felt scared or panicky for no good reason. 0  Things have been getting on top of me. 1  I have been so unhappy that I have had difficulty sleeping. 0  I have felt sad or miserable. 1  I have been so unhappy that I have been crying. 0  The thought of harming myself has occurred to me. 0  Edinburgh Postnatal Depression Scale Total 3    After visit meds:  Allergies as of 02/18/2021       Reactions   Kiwi Extract Other (See Comments)   Numbness and tingling in mouth when eating kiwi        Medication List     STOP taking these medications    docusate sodium  100 MG capsule Commonly known as: COLACE   Proctofoam HC rectal foam Generic drug: hydrocortisone-pramoxine   valACYclovir 500 MG tablet Commonly known as: VALTREX       TAKE these medications    ibuprofen 600 MG tablet Commonly known as: ADVIL Take 1 tablet (600 mg total) by mouth every 6 (six) hours.   multivitamin-prenatal 27-0.8 MG Tabs tablet Take 1 tablet by mouth daily at 12 noon.       Discharge home in stable condition Infant Feeding: Breast Infant Disposition:home with mother Discharge instruction: per After Visit Summary and Postpartum booklet. Activity: Advance as tolerated. Pelvic rest for 6 weeks.  Diet: routine diet Future Appointments: Future Appointments  Date Time Provider [place]  03/24/2021  1:10 PM Truett Mainland, DO [facility] None   Follow up Visit: Sent to [facility] by Dr. Higinio Plan on 02/16/21 at 9:40am  Please schedule this patient for a In person postpartum visit in 4 weeks with the following provider: Any provider. Additional Postpartum F/U: None Low risk pregnancy complicated by:  polyhydraminos, fetal pyelectasis Delivery mode:  Vaginal, Spontaneous  Anticipated Birth Control:  Depo   GME ATTESTATION:  I saw and evaluated the patient. I agree with the findings and the plan of care as documented in the resident's note and have made any necessary changes.  Vilma Meckel, MD OB Fellow, [place] [facility] 02/18/2021 2:43 PM

## 2021-02-16 NOTE — MAU Note (Signed)
Elizabeth [name] is a 25 y.o. at [redacted]w[redacted]d here in MAU reporting: has been contracting all day, states she had her membranes swept in the office. Contractions are less than 10 minutes. Also 1 episode of leaking, states it was watery and bloody. +FM. Last IC was today.  Onset of complaint: yesterday  Pain score: 8/10  Vitals:   02/16/21 0133  BP: 113/67  Pulse: 79  Resp: 16  Temp: 98.4 F (36.9 C)  SpO2: 100%     FHT: +FM, pt wearing a form fitting dress  Lab orders placed from triage: none

## 2021-02-16 NOTE — H&P (Addendum)
OBSTETRIC ADMISSION HISTORY AND PHYSICAL  Elizabeth [name] is a 25 y.o. female [phone] with IUP at [redacted]w[redacted]d by LMP presenting for SROM. She reports +FMs, No LOF, no VB, no blurry vision, headaches or peripheral edema, and RUQ pain.  She plans on breast feeding.  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    pt reports last outbreak years ago   Vaginal Pap smear, abnormal     Past Surgical History: Past Surgical History:  Procedure Laterality Date   NO PAST SURGERIES      Obstetrical History: OB History     Gravida  5   Para  1   Term  0   Preterm  1   AB  3   Living  1      SAB  2   IAB  1   Ectopic      Multiple      Live Births  1           Social History Social History   Socioeconomic History   Marital status: Single    Spouse name: Not on file   Number of children: 1   Years of education: Not on file   Highest education level: Some college, no degree  Occupational History   Not on file  Tobacco Use   Smoking status: Some Days    Packs/day: 0.25    Types: Cigarettes   Smokeless tobacco: Never  Vaping Use   Vaping Use: Never used  Substance and Sexual Activity   Alcohol use: Not Currently   Drug use: Not Currently   Sexual activity: Yes    Comment: last intercourse: 12-30-2020  Other Topics Concern   Not on file  Social History Narrative   Not on file   Social Determinants of Health   Financial Resource Strain: Not on file  Food Insecurity: Not on file  Transportation Needs: Not on file  Physical Activity: Not on file  Stress: Not on file  Social Connections: Not on  file    Family History: Family History  Problem Relation Age of Onset   Hypertension Mother    Cancer Neg Hx    Diabetes Neg Hx     Allergies: Allergies  Allergen Reactions   Kiwi Extract Other (See Comments)    Numbness and tingling in mouth when eating kiwi    Medications Prior to Admission  Medication Sig Dispense Refill Last Dose   valACYclovir (VALTREX) 500 MG tablet Take 1 tablet (500 mg total) by mouth 2 (two) times daily. 30 tablet 6 Past Month   docusate sodium (COLACE) 100 MG capsule Take 1 capsule (100 mg total) by mouth 2 (two) times daily as needed. (Patient not taking: No sig reported) 100 capsule 2 More than a month   hydrocortisone-pramoxine (PROCTOFOAM HC) rectal foam Place 1 applicator rectally 2 (two) times daily. (Patient not taking: No sig reported) 10 g 3 More than a month   Prenatal Vit-Fe Fumarate-FA (MULTIVITAMIN-PRENATAL) 27-0.8 MG TABS tablet Take 1 tablet by mouth daily at 12 noon. 30 tablet 0 More than a month     Review of Systems   All  systems reviewed and negative except as stated in HPI  Blood pressure 113/67, pulse 79, temperature 98.4 F (36.9 C), temperature source Oral, resp. rate 16, height 5\' 4"  (1.626 m), weight 60.7 kg, SpO2 100 %. General appearance: alert, cooperative, and appears stated age Lungs: clear to auscultation bilaterally Heart: regular rate and rhythm Abdomen: soft, non-tender; bowel sounds normal Pelvic: As stated below Extremities: Homans sign is negative, no sign of DVT Presentation: cephalic Fetal monitoringBaseline: 120 bpm, Variability: Good {> 6 bpm), Accelerations: Reactive, and Decelerations: Absent Uterine activity: Contractions q6min Dilation: 3.5 Effacement (%): 50 Station: -3 Exam by:: [email or web address] NP   Prenatal labs: ABO, Rh: A/Positive/-- (01/10 0000) Antibody:   Rubella: Immune (01/10 0000) RPR: Non Reactive (05/17 0844)  HBsAg: Negative (01/10 0000)  HIV: Non Reactive (05/17 0844)  GBS:  Negative/-- (07/12 1109)  2 hr Glucola passed Genetic screening  NIPS: panorama nml, AFP: wnl, CF: wnl Anatomy 03-[birth year]-1971: Renal pyelectasis> Rt 7.4 mm and Lt 11.6 mm; polyhydramnios. Otherwise-anatomy nml  Prenatal Transfer Tool  Maternal Diabetes: No Genetic Screening: Normal Maternal Ultrasounds/Referrals: Fetal renal pyelectasis Fetal Ultrasounds or other Referrals:  Referred to Materal Fetal Medicine  Maternal Substance Abuse:  No Significant Maternal Medications:  Meds include: Other: Valtrex Significant Maternal Lab Results: Group B Strep negative  Results for orders placed or performed during the hospital encounter of 02/16/21 (from the past 24 hour(s))  POCT fern test   Collection Time: 02/16/21  2:36 AM  Result Value Ref Range   POCT Fern Test Negative = intact amniotic membranes   Fern Test   Collection Time: 02/16/21  4:02 AM  Result Value Ref Range   POCT Fern Test Positive = ruptured amniotic membanes   CBC   Collection Time: 02/16/21  4:31 AM  Result Value Ref Range   WBC 13.3 (H) 4.0 - 10.5 K/uL   RBC 3.99 3.87 - 5.11 MIL/uL   Hemoglobin 11.9 (L) 12.0 - 15.0 g/dL   HCT 04/18/21 (L) 23.7 - 62.8 %   MCV 89.0 80.0 - 100.0 fL   MCH 29.8 26.0 - 34.0 pg   MCHC 33.5 30.0 - 36.0 g/dL   RDW 31.5 17.6 - 16.0 %   Platelets 171 150 - 400 K/uL   nRBC 0.0 0.0 - 0.2 %    Patient Active Problem List   Diagnosis Date Noted   Supervision of other normal pregnancy, antepartum 11/16/2020   H/O incompetent cervix, currently pregnant 11/16/2020   HSV infection 11/16/2020    Assessment/Plan:  Elizabeth [name] is a 25 y.o. 30 at [redacted]w[redacted]d here for SROM.   #Labor: Allow for expectant management. Pt is progressing well. May augment with pit if needed.  #Pain: PRN-Epidural if desired  #FWB: Cat 1 #ID: GBS>neg #MOF: breast #MOC:Unsure #HSV: Speculum exam performed in MAU-no lesions found. On valtrex   [redacted]w[redacted]d, MD  02/16/2021, 5:08 AM    I have seen and examined this patient  and agree with above documentation in the resident's note.    04/18/2021, MSN, CNM 02/16/21 6:15 AM

## 2021-02-16 NOTE — Anesthesia Postprocedure Evaluation (Signed)
Anesthesia Post Note  Patient: Elizabeth [name]  Procedure(s) Performed: AN AD HOC LABOR EPIDURAL     Patient location during evaluation: Mother Baby Anesthesia Type: Epidural Level of consciousness: awake and alert Pain management: pain level controlled Vital Signs Assessment: post-procedure vital signs reviewed and stable Respiratory status: spontaneous breathing, nonlabored ventilation and respiratory function stable Cardiovascular status: stable Postop Assessment: no headache, no backache and epidural receding Anesthetic complications: no   No notable events documented.  Last Vitals:  Vitals:   02/16/21 1130 02/16/21 1214  BP: (!) 102/58 103/63  Pulse: (!) 57 60  Resp: 18 16  Temp: 36.9 C 36.9 C  SpO2: 100% 100%    Last Pain:  Vitals:   02/16/21 1214  TempSrc: Oral  PainSc: 0-No pain   Pain Goal:                   Aimie Wagman

## 2021-02-17 ENCOUNTER — Inpatient Hospital Stay (HOSPITAL_COMMUNITY)
Admission: AD | Admit: 2021-02-17 | Payer: Medicaid Other | Source: Home / Self Care | Admitting: Obstetrics & Gynecology

## 2021-02-17 ENCOUNTER — Inpatient Hospital Stay (HOSPITAL_COMMUNITY): Payer: Medicaid Other

## 2021-02-17 DIAGNOSIS — O403XX1 Polyhydramnios, third trimester, fetus 1: Secondary | ICD-10-CM | POA: Diagnosis not present

## 2021-02-17 DIAGNOSIS — O4202 Full-term premature rupture of membranes, onset of labor within 24 hours of rupture: Secondary | ICD-10-CM | POA: Diagnosis not present

## 2021-02-17 DIAGNOSIS — O9852 Other viral diseases complicating childbirth: Secondary | ICD-10-CM | POA: Diagnosis not present

## 2021-02-17 DIAGNOSIS — Z3A39 39 weeks gestation of pregnancy: Secondary | ICD-10-CM | POA: Diagnosis not present

## 2021-02-17 NOTE — Progress Notes (Signed)
Patient screened out for psychosocial assessment since none of the following apply: °Psychosocial stressors documented in mother or baby's chart °Gestation less than 32 weeks °Code at delivery  °Infant with anomalies °Please contact the Clinical Social Worker if specific needs arise, by MOB's request, or if MOB scores greater than 9/yes to question 10 on Edinburgh Postpartum Depression Screen. ° °Nolon Yellin Boyd-Gilyard, MSW, LCSW °Clinical Social Work °[phone] °  °

## 2021-02-17 NOTE — Lactation Note (Signed)
This note was copied from a baby's chart. Lactation Consultation Note  Patient Name: Elizabeth [name] WCHEN'I Date: 02/17/2021 Reason for consult: Follow-up assessment;Term;NICU baby Age:26 hours  Visited with mom of 60 hours old FT female, baby was transferred to the NICU due to hypoglycemia yesterday. Mom is planning on doing both, breast and formula, she's already supplementing with Similac 24/20 calorie formula.  Encouraged to start pumping consistently for the onset of lactogenesis II, she's already getting some drops of colostrum. Reviewed normal newborn behavior, lactogenesis II, pumping schedule, newborn hypoglycemia and benefits of STS care.  Continue current plan of care. FOB present, all questions and concerns answered. Mom to call lactation PRN for feeding assist or if any question/concerns arise.  Maternal Data   Mom's milk volume is WNL  Feeding Mother's Current Feeding Choice: Breast Milk Nipple Type: Slow - flow  Lactation Tools Discussed/Used Tools: Pump;Flanges;Coconut oil Flange Size: 21 Breast pump type: Double-Electric Breast Pump Pump Education: Setup, frequency, and cleaning;Milk Storage Reason for Pumping: Baby was transferred to NICU Pumping frequency: q 3 hours, but mom hasn't been pumping consistently Pumped volume:  (drops)  Interventions Interventions: Breast feeding basics reviewed;DEBP;Education;Coconut oil  Discharge Pump: DEBP;Manual  Consult Status Consult Status: Follow-up Follow-up type: In-patient    Thelma Lorenzetti Venetia Constable 02/17/2021, 2:56 PM

## 2021-02-17 NOTE — Progress Notes (Signed)
POSTPARTUM PROGRESS NOTE  Subjective: Elizabeth [name] is a 25 y.o. [id number] s/p PPD#1 SVD at [redacted]w[redacted]d.  She reports she doing well. No acute events overnight. She denies any problems with ambulating, voiding or po intake. Denies nausea or vomiting. She has passed flatus. Pain is well controlled.  Lochia is appropriate.  Objective: Blood pressure (!) 91/52, pulse 63, temperature 98.4 F (36.9 C), temperature source Oral, resp. rate 18, height 5\' 4"  (1.626 m), weight 60.7 kg, SpO2 98 %, unknown if currently breastfeeding.  Physical Exam:  General: alert, cooperative and no distress Chest: no respiratory distress Abdomen: soft, non-tender  Uterine Fundus: firm and at level of umbilicus Extremities: No calf swelling or tenderness  No edema  Recent Labs    02/16/21 0431  HGB 11.9*  HCT 35.5*    Assessment/Plan: Elizabeth [name] is a 25 y.o. 30 s/p PPD#1 SVD at [redacted]w[redacted]d..  Routine Postpartum Care: Doing well, pain well-controlled.  -- Continue routine care, lactation support  -- Contraception: depo -- Feeding: breast --Doing well, plan to stay today as infant is in NICU  Dispo: Plan for discharge tomorrow>infant indication.  [redacted]w[redacted]d, MD Faculty Practice, [facility] 02/17/2021 6:36 AM  Attestation of Supervision of Student:  I confirm that I have verified the information documented in the medical student's note and that I have also personally performed the history, physical exam and all medical decision making activities.  I have verified that all services and findings are accurately documented in this student's note; and I agree with management and plan as outlined in the documentation. I have also made any necessary editorial changes.  04/19/2021, CNM [facility] for [facility], [facility] 02/17/2021 11:53 AM

## 2021-02-18 ENCOUNTER — Other Ambulatory Visit (HOSPITAL_BASED_OUTPATIENT_CLINIC_OR_DEPARTMENT_OTHER): Payer: Self-pay

## 2021-02-18 MED ORDER — MEDROXYPROGESTERONE ACETATE 150 MG/ML IM SUSP
150.0000 mg | Freq: Once | INTRAMUSCULAR | Status: AC
  Administered 2021-02-18: 150 mg via INTRAMUSCULAR
  Filled 2021-02-18: qty 1

## 2021-02-18 MED ORDER — IBUPROFEN 600 MG PO TABS
600.0000 mg | ORAL_TABLET | Freq: Four times a day (QID) | ORAL | 0 refills | Status: DC
  Filled 2021-02-18: qty 30, 8d supply, fill #0

## 2021-02-18 NOTE — Lactation Note (Signed)
This note was copied from a baby's chart. Lactation Consultation Note  Patient Name: Elizabeth [name] AYOKH'T Date: 02/18/2021 Reason for consult: Follow-up assessment;Term Age:26 hours  LC in to visit with P2 Mom of term baby on day of discharge.  Baby at 2.5% weight loss and is being formula fed by bottle.  Mom has a DEBP set up and has inconsistently been pumping.  Baby is currently >6 lbs and Mom states baby is sleepy on the breast.   Baby latched in football hold.  Assisted Mom with more pillow elevation and a deeper latch to breast.  Baby noted to have deep jaw extensions and swallows.    Encouraged Mom to keep baby STS and offer breast with cues.  If Mom is supplementing with formula, Mom to pump both her breasts to support her milk supply.   Recommended OP lactation F/U and Mom would like a referral. Message sent to [facility].  Referral sent to [facility] also for a DEBP.  Engorgement prevention and treatment reviewed.  Mom aware of OP Lactation support available to her.   Feeding Mother's Current Feeding Choice: Breast Milk and Formula Nipple Type: Extra Slow Flow  LATCH Score Latch: Grasps breast easily, tongue down, lips flanged, rhythmical sucking.  Audible Swallowing: Spontaneous and intermittent  Type of Nipple: Everted at rest and after stimulation  Comfort (Breast/Nipple): Soft / non-tender  Hold (Positioning): Assistance needed to correctly position infant at breast and maintain latch.  LATCH Score: 9   Lactation Tools Discussed/Used Tools: Pump;Bottle Breast pump type: Double-Electric Breast Pump  Interventions Interventions: Breast feeding basics reviewed;Assisted with latch;Skin to skin;Breast massage;Hand express;Breast compression;Adjust position;Support pillows;Position options;DEBP;Hand pump;Education  Discharge Discharge Education: Engorgement and breast care;Warning signs for feeding baby;Outpatient recommendation;Outpatient Epic message sent Pump:  DEBP;Manual WIC Program: Yes  Consult Status Consult Status: Complete Date: 02/18/21 Follow-up type: Call as needed    Elizabeth [name] 02/18/2021, 12:04 PM

## 2021-02-21 ENCOUNTER — Inpatient Hospital Stay (HOSPITAL_COMMUNITY): Admit: 2021-02-21 | Payer: Self-pay

## 2021-03-02 ENCOUNTER — Telehealth (HOSPITAL_COMMUNITY): Payer: Self-pay

## 2021-03-02 NOTE — Telephone Encounter (Signed)
Doing well, providing breast milk through pumping.  Baby had appointment and safe sleep reviewed.  No concerns, EPDS 3

## 2021-03-17 DIAGNOSIS — Z419 Encounter for procedure for purposes other than remedying health state, unspecified: Secondary | ICD-10-CM | POA: Diagnosis not present

## 2021-03-24 ENCOUNTER — Encounter: Payer: Self-pay | Admitting: Family Medicine

## 2021-03-24 ENCOUNTER — Encounter: Payer: Self-pay | Admitting: General Practice

## 2021-03-24 ENCOUNTER — Other Ambulatory Visit: Payer: Self-pay

## 2021-03-24 ENCOUNTER — Ambulatory Visit (INDEPENDENT_AMBULATORY_CARE_PROVIDER_SITE_OTHER): Payer: Medicaid Other | Admitting: Family Medicine

## 2021-03-24 NOTE — Progress Notes (Signed)
Post Partum Visit Note  Elizabeth [name] is a 25 y.o. [phone] female who presents for a postpartum visit. She is 5 weeks postpartum following a normal spontaneous vaginal delivery.  I have fully reviewed the prenatal and intrapartum course. The delivery was at 39 gestational weeks.  Anesthesia: epidural. Postpartum course has been normal. Baby is doing well. Baby is feeding by bottle - Similac Advance. Bleeding red. Bowel function is normal. Bladder function is normal. Patient is not sexually active. Contraception method is Depo-Provera injections. Postpartum depression screening: negative.   The pregnancy intention screening data noted above was reviewed. Potential methods of contraception were discussed. The patient elected to proceed with No data recorded.   Edinburgh Postnatal Depression Scale - 03/24/21 1327       Edinburgh Postnatal Depression Scale:  In the Past 7 Days   I have been able to laugh and see the funny side of things. 0    I have looked forward with enjoyment to things. 0    I have blamed myself unnecessarily when things went wrong. 0    I have been anxious or worried for no good reason. 0    I have felt scared or panicky for no good reason. 0    Things have been getting on top of me. 0    I have been so unhappy that I have had difficulty sleeping. 0    I have felt sad or miserable. 0    I have been so unhappy that I have been crying. 0    The thought of harming myself has occurred to me. 0    Edinburgh Postnatal Depression Scale Total 0             Health Maintenance Due  Topic Date Due   COVID-19 Vaccine (1) Never done   Pneumococcal Vaccine 109-21 Years old (1 - PCV) Never done   HPV VACCINES (1 - 2-dose series) Never done   INFLUENZA VACCINE  Never done    The following portions of the patient's history were reviewed and updated as appropriate: allergies, current medications, past family history, past medical history, past social history, past surgical  history, and problem list.  Review of Systems Pertinent items are noted in HPI.  Objective:  BP 113/64   Pulse 76   Wt 116 lb (52.6 kg)   BMI 19.91 kg/m    General:  alert, cooperative, and no distress   Breasts:  not indicated  Lungs: clear to auscultation bilaterally  Heart:  regular rate and rhythm, S1, S2 normal, no murmur, click, rub or gallop  Abdomen: soft, non-tender; bowel sounds normal; no masses,  no organomegaly   Wound N/a  GU exam:  not indicated       Assessment:   1. Postpartum exam      Plan:   Essential components of care per ACOG recommendations:  1.  Mood and well being: Patient with negative depression screening today. Reviewed local resources for support.  - Patient tobacco use? No.   - hx of drug use? No.    2. Infant care and feeding:  -Patient currently breastmilk feeding? No.  -Social determinants of health (SDOH) reviewed in EPIC. No concerns  3. Sexuality, contraception and birth spacing - Patient does not want a pregnancy in the next year.    - Reviewed forms of contraception in tiered fashion. Patient desired Depo-Provera today.   - Discussed birth spacing of 18 months  4. Sleep and fatigue -Encouraged family/partner/community  support of 4 hrs of uninterrupted sleep to help with mood and fatigue  5. Physical Recovery  - Discussed patients delivery and complications. She describes her labor as good. - Patient had a Vaginal, no problems at delivery. Patient had a 1st degree laceration. Perineal healing reviewed. Patient expressed understanding - Patient has urinary incontinence? No. - Patient is safe to resume physical and sexual activity  6.  Health Maintenance - HM due items addressed Yes - Last pap smear No results found for: DIAGPAP Pap smear not done at today's visit.  -Breast Cancer screening indicated? No.   7. Chronic Disease/Pregnancy Condition follow up: None  - PCP follow up  Levie Heritage, DO Center for [facility]  Healthcare, [facility]

## 2021-04-16 DIAGNOSIS — Z419 Encounter for procedure for purposes other than remedying health state, unspecified: Secondary | ICD-10-CM | POA: Diagnosis not present

## 2021-04-27 ENCOUNTER — Ambulatory Visit: Payer: Medicaid Other | Admitting: Obstetrics & Gynecology

## 2021-05-09 ENCOUNTER — Ambulatory Visit (INDEPENDENT_AMBULATORY_CARE_PROVIDER_SITE_OTHER): Payer: Medicaid Other

## 2021-05-09 ENCOUNTER — Other Ambulatory Visit: Payer: Self-pay

## 2021-05-09 DIAGNOSIS — Z3042 Encounter for surveillance of injectable contraceptive: Secondary | ICD-10-CM

## 2021-05-09 MED ORDER — MEDROXYPROGESTERONE ACETATE 150 MG/ML IM SUSP
150.0000 mg | Freq: Once | INTRAMUSCULAR | Status: AC
  Administered 2021-05-09: 150 mg via INTRAMUSCULAR

## 2021-05-09 NOTE — Progress Notes (Signed)
Date last pap: due to be scheduled Last Depo 02-17-21 Side Effects if any: none.  Depo-Provera 150 mg IM given by: Armandina Stammer RN . Next appointment Depo prover due 07-25-21 to 08-08-21.  Armandina Stammer RN

## 2021-05-17 DIAGNOSIS — Z419 Encounter for procedure for purposes other than remedying health state, unspecified: Secondary | ICD-10-CM | POA: Diagnosis not present

## 2021-06-20 ENCOUNTER — Other Ambulatory Visit: Payer: Self-pay

## 2021-06-20 ENCOUNTER — Ambulatory Visit (INDEPENDENT_AMBULATORY_CARE_PROVIDER_SITE_OTHER): Payer: Self-pay

## 2021-06-20 ENCOUNTER — Other Ambulatory Visit (HOSPITAL_COMMUNITY)
Admission: RE | Admit: 2021-06-20 | Discharge: 2021-06-20 | Disposition: A | Discharge status: Home / Self Care | Payer: Medicaid Other | Source: Ambulatory Visit | Attending: Obstetrics & Gynecology | Admitting: Obstetrics & Gynecology

## 2021-06-20 VITALS — BP 117/59 | HR 68 | Wt 120.0 lb

## 2021-06-20 DIAGNOSIS — N898 Other specified noninflammatory disorders of vagina: Secondary | ICD-10-CM | POA: Diagnosis not present

## 2021-06-20 DIAGNOSIS — N949 Unspecified condition associated with female genital organs and menstrual cycle: Secondary | ICD-10-CM

## 2021-06-20 DIAGNOSIS — Z113 Encounter for screening for infections with a predominantly sexual mode of transmission: Secondary | ICD-10-CM

## 2021-06-20 NOTE — Progress Notes (Addendum)
SUBJECTIVE:  25 y.o. female complains of clear vaginal discharge for 5 day(s). Denies abnormal vaginal bleeding or significant pelvic pain or fever. No UTI symptoms. Denies history of known exposure to STD. Patient denies no new sexual partners.   Patient on Depo Provera for birth control- doesn't have periods.  OBJECTIVE:  She appears well, afebrile.   ASSESSMENT:  Vaginal Discharge  Vaginal Odor   PLAN:  GC, chlamydia, trichomonas, BVAG, CVAG probe sent to lab. Treatment: To be determined once lab results are received ROV prn if symptoms persist or worsen.   Attestation of Attending Supervision of RN: Evaluation and management procedures were performed by the nurse under my supervision and collaboration.  I have reviewed the nursing note and chart, and I agree with the management and plan.  Carolyn L. Harraway-Smith, M.D., [facility]

## 2021-06-21 LAB — CERVICOVAGINAL ANCILLARY ONLY
Bacterial Vaginitis (gardnerella): POSITIVE — AB
Candida Glabrata: NEGATIVE
Candida Vaginitis: POSITIVE — AB
Chlamydia: NEGATIVE
Comment: NEGATIVE
Comment: NEGATIVE
Comment: NEGATIVE
Comment: NEGATIVE
Comment: NEGATIVE
Comment: NORMAL
Neisseria Gonorrhea: NEGATIVE
Trichomonas: NEGATIVE

## 2021-06-23 ENCOUNTER — Other Ambulatory Visit: Payer: Self-pay | Admitting: Obstetrics & Gynecology

## 2021-06-23 ENCOUNTER — Other Ambulatory Visit (HOSPITAL_BASED_OUTPATIENT_CLINIC_OR_DEPARTMENT_OTHER): Payer: Self-pay

## 2021-06-23 DIAGNOSIS — B3731 Acute candidiasis of vulva and vagina: Secondary | ICD-10-CM

## 2021-06-23 DIAGNOSIS — B9689 Other specified bacterial agents as the cause of diseases classified elsewhere: Secondary | ICD-10-CM

## 2021-06-23 MED ORDER — METRONIDAZOLE 500 MG PO TABS
500.0000 mg | ORAL_TABLET | Freq: Two times a day (BID) | ORAL | 0 refills | Status: DC
  Filled 2021-06-23: qty 14, 7d supply, fill #0

## 2021-06-23 MED ORDER — TERCONAZOLE 0.4 % VA CREA
1.0000 | TOPICAL_CREAM | Freq: Every day | VAGINAL | 0 refills | Status: DC
  Filled 2021-06-23: qty 45, 7d supply, fill #0

## 2021-06-24 ENCOUNTER — Telehealth: Payer: Self-pay

## 2021-06-24 NOTE — Telephone Encounter (Signed)
-----   Message from Willodean Rosenthal, MD sent at 06/23/2021  4:15 PM EST ----- Please call pt. She has BV and yeast. Rx needed.   Orders placed.   Thanks,    Clh-S

## 2021-06-24 NOTE — Telephone Encounter (Signed)
Unable to leave message for patient. Sent my chart message. Armandina Stammer RN

## 2021-06-29 ENCOUNTER — Encounter: Payer: Self-pay | Admitting: General Practice

## 2021-06-29 ENCOUNTER — Ambulatory Visit: Payer: Medicaid Other | Admitting: Family Medicine

## 2021-06-30 ENCOUNTER — Other Ambulatory Visit (HOSPITAL_BASED_OUTPATIENT_CLINIC_OR_DEPARTMENT_OTHER): Payer: Self-pay

## 2021-07-26 ENCOUNTER — Ambulatory Visit: Payer: Medicaid Other

## 2021-08-17 DIAGNOSIS — Z419 Encounter for procedure for purposes other than remedying health state, unspecified: Secondary | ICD-10-CM | POA: Diagnosis not present

## 2021-08-22 IMAGING — US US MFM OB FOLLOW-UP
1 series · 13 of 28 positions shown · non-contrast
Comparison: none

[Series 1: us mfm ob follow-up · 13 of 73 slices shown]
[im 3/73]
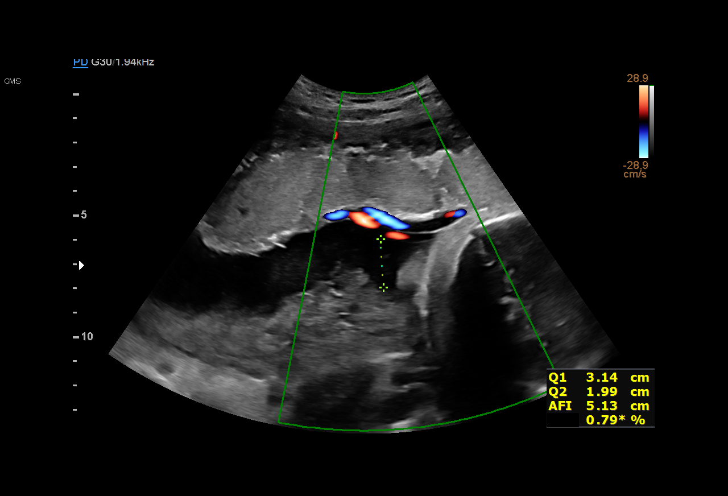
[im 9/73]
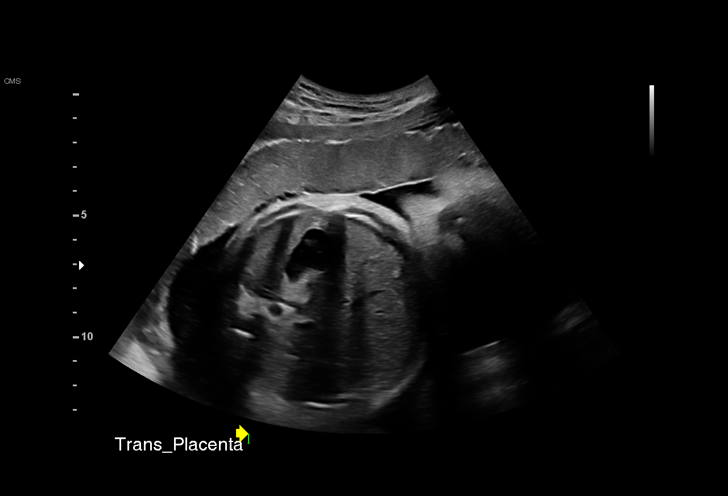
[im 14/73]
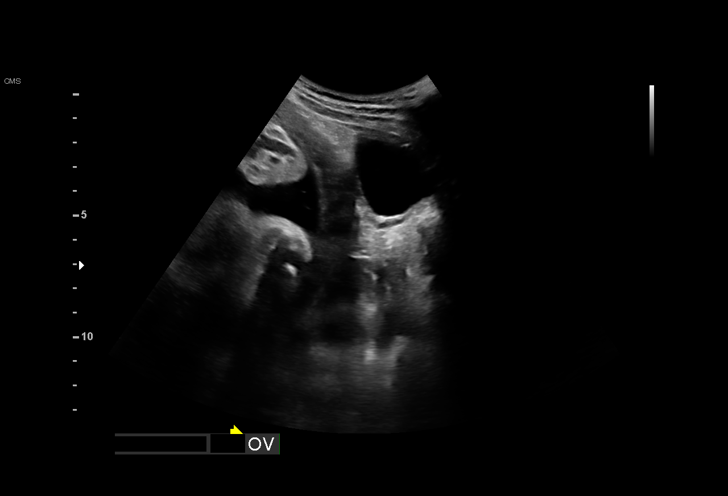
[im 19/73]
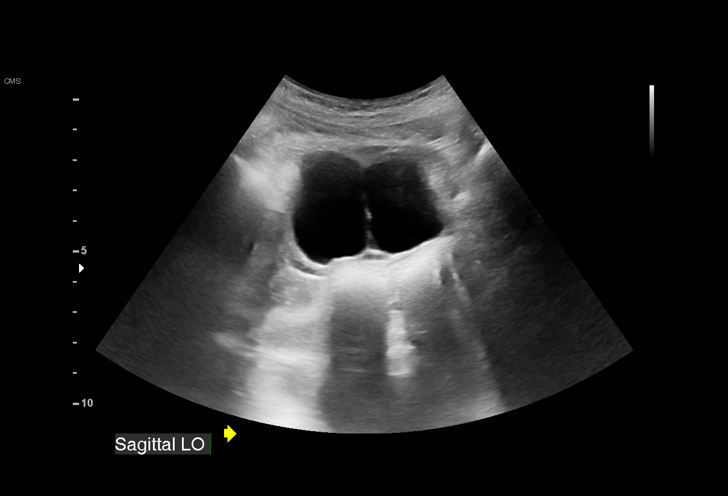
[im 25/73]
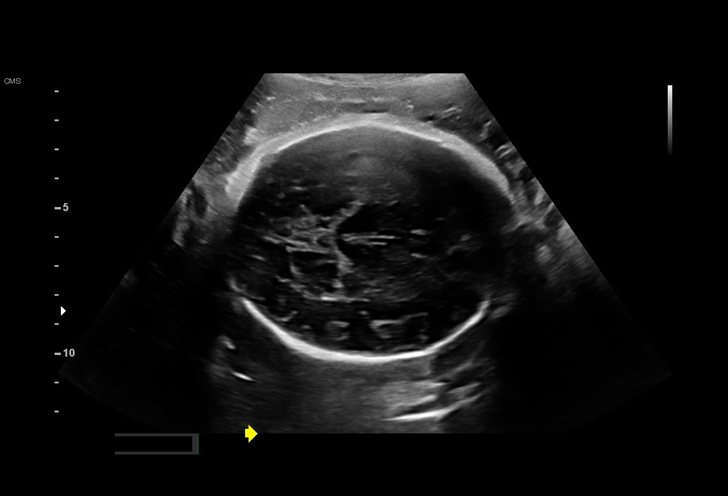
[im 30/73]
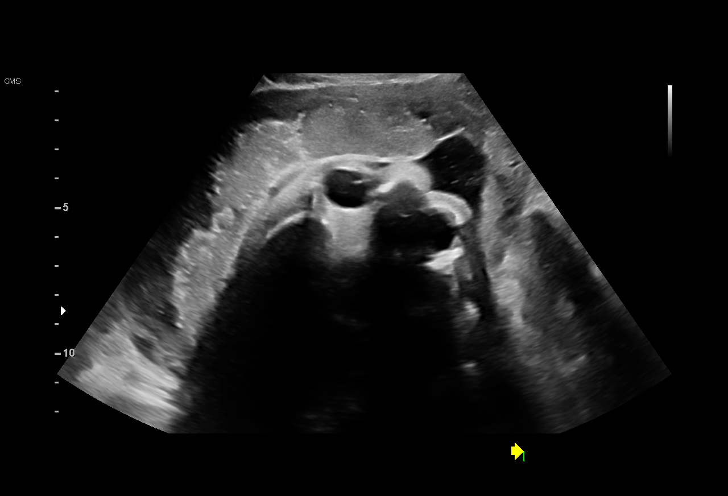
[im 38/73]
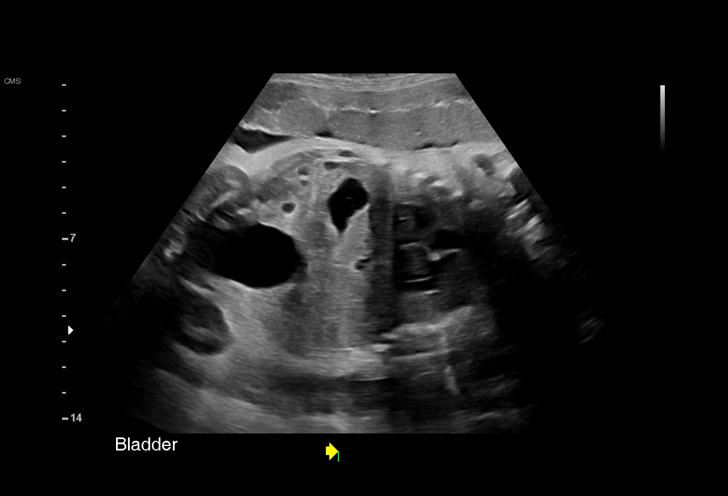
[im 43/73]
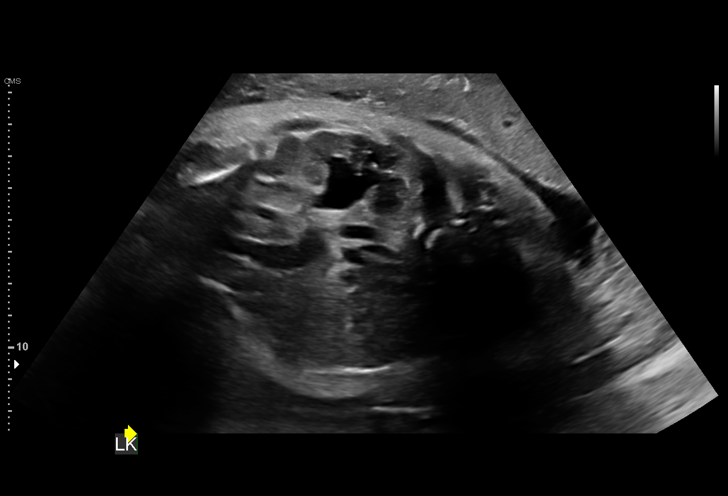
[im 49/73]
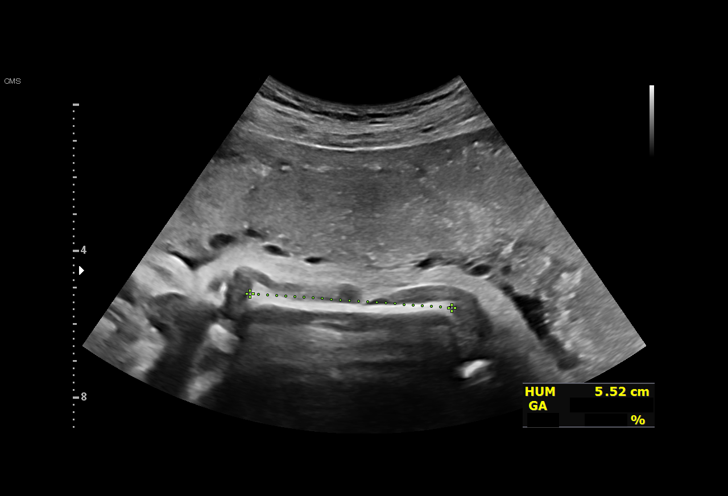
[im 54/73]
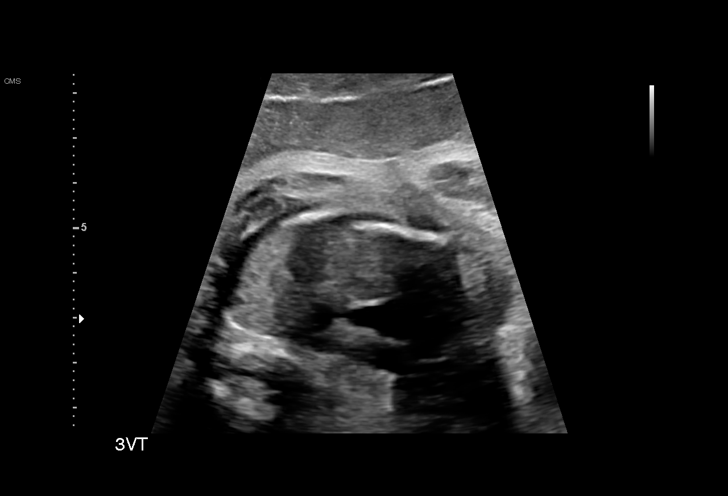
[im 59/73]
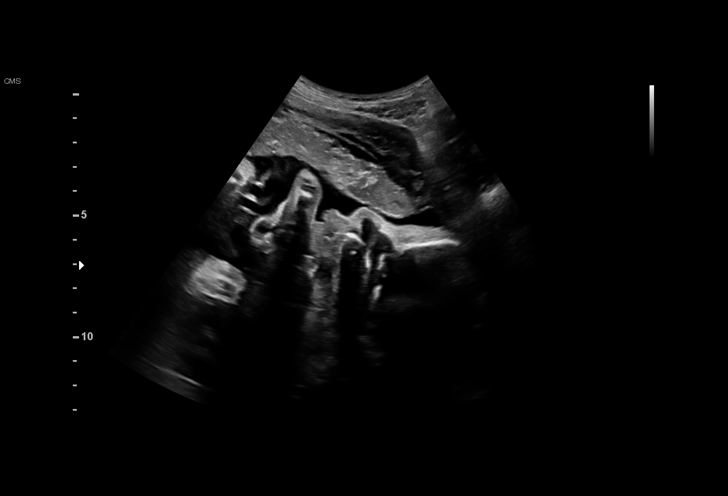
[im 65/73]
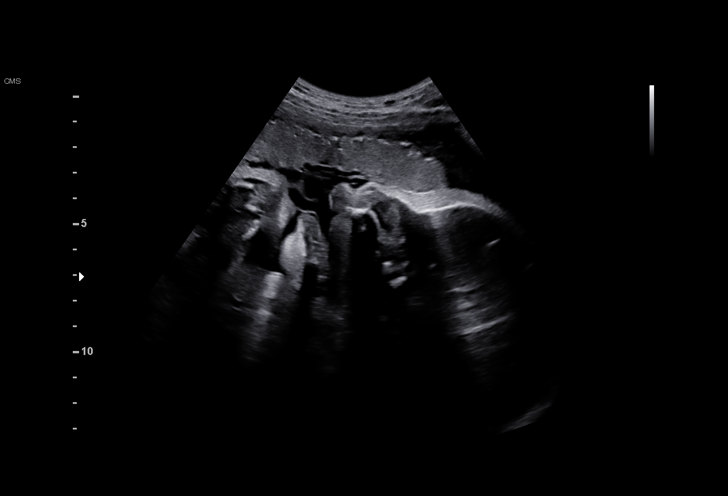
[im 70/73]
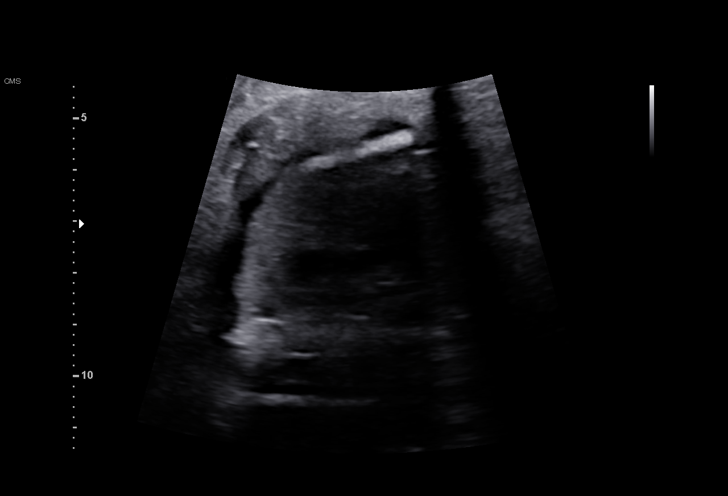

[13 of 28 positions shown; findings below may reference images not displayed]

SAJID

Indications

 Pyelectasis of fetus on prenatal ultrasound
 Poor obstetric history: Previous preterm
 delivery, antepartum
 34 weeks gestation of pregnancy
 Cervical incompetence, third trimester
 Antenatal follow-up for nonvisualized fetal
 anatomy
 Low risk NIPS, neg AFP, neg CF carrier
Fetal Evaluation

 Num Of Fetuses:         1
 Fetal Heart Rate(bpm):  153
 Cardiac Activity:       Observed
 Presentation:           Cephalic
 Placenta:               Anterior Right
 P. Cord Insertion:      Previously Visualized

 Amniotic Fluid
 AFI FV:      Within normal limits

 AFI Sum(cm)     %Tile       Largest Pocket(cm)
 19.6            73

 RUQ(cm)       RLQ(cm)       LUQ(cm)        LLQ(cm)
 3.1           8.2           2
Biometry
 BPD:      80.2  mm     G. Age:  32w 1d        3.2  %    CI:        73.99   %    70 - 86
                                                         FL/HC:      21.0   %    20.1 -
 HC:      296.1  mm     G. Age:  32w 5d          1  %    HC/AC:      0.98        0.93 -
 AC:      301.1  mm     G. Age:  34w 1d         41  %    FL/BPD:     77.6   %    71 - 87
 FL:       62.2  mm     G. Age:  32w 2d        2.9  %    FL/AC:      20.7   %    20 - 24
 HUM:      54.9  mm     G. Age:  32w 0d          9  %
 LV:        6.1  mm

 Est. FW:    6130  gm    4 lb 12 oz      13  %
OB History

 Gravidity:    6         Term:   0        Prem:   1        SAB:   3
 TOP:          1        Living:  1
Gestational Age

 LMP:           34w 4d        Date:  05/17/20                 EDD:   02/21/21
 Clinical EDD:  34w 4d                                        EDD:   02/21/21
 U/S Today:     32w 6d                                        EDD:   03/05/21
 Best:          34w 4d     Det. By:  LMP  (05/17/20)          EDD:   02/21/21
Anatomy

 Cranium:               Appears normal         LVOT:                   Previously seen
 Cavum:                 Appears normal         Aortic Arch:            Appears normal
 Ventricles:            Appears normal         Ductal Arch:            Not well visualized
 Choroid Plexus:        Previously seen        Diaphragm:              Appears normal
 Cerebellum:            Previously seen        Stomach:                Appears normal, left
                                                                       sided
 Posterior Fossa:       Previously seen        Abdomen:                Appears normal
 Nuchal Fold:           Not applicable (>20    Abdominal Wall:         Previously seen
                        wks GA)
 Face:                  Appears normal         Cord Vessels:           Previously seen
                        (orbits and profile)
 Lips:                  Previously seen        Kidneys:                Left sided
                                                                       pyelectasis, 10
                                                                       mm
 Palate:                Appears normal         Bladder:                Appears normal
 Thoracic:              Appears normal         Spine:                  Previously seen
 Heart:                 Appears normal         Upper Extremities:      Previously seen
                        (4CH, axis, and
                        situs)
 RVOT:                  Previously seen        Lower Extremities:      Previously seen

 Other:  Male gender previously visualized. 3VV visualized. Nasal bone
         visualized. Lenses visualized. VC previously visualized. Heels
         previously visualized.
Cervix Uterus Adnexa

 Cervix
 Not visualized (advanced GA >21wks)

 Uterus
 No abnormality visualized.
 Right Ovary
 Within normal limits.

 Left Ovary
 Septated vs 2 cysts

 Cul De Sac
 No free fluid seen.

 Adnexa
 No abnormality visualized.
Comments

 This patient was seen for a follow up growth scan as left
 pyelectasis was noted on her prior exam.  She denies any
 problems since her last exam.
 She was informed that the fetal growth and amniotic fluid
 level appears appropriate for her gestational age.
 Left pyelectasis/hydronephrosis measuring about 1.0 to
 cm continues to be noted.  The implications and
 management of pylectasis/hydronephrosis was discussed
 with the patient. She was advised that the pyelectasis noted
 on prenatal ultrasounds will often resolve spontaneously after
 birth.  However, there may also be other processes such as
 an obstruction or reflux causing this finding that may require
 treatment after birth.  She was advised to notify her
 pediatrician regarding the left pyelectasis/hydronephrosis that
 was noted during her prenatal ultrasounds.  Her pediatrician
 will order additional imaging studies of the baby's kidneys if
 necessary.
 As the fetal growth is in the lower normal range, a follow-up
 growth scan was scheduled in 3 weeks.

## 2021-09-12 IMAGING — US US MFM OB FOLLOW-UP
1 series · 10 of 22 positions shown · non-contrast
Comparison: none

[Series 1: us mfm ob follow-up · 22 acquisitions, 10 frames shown]
[im 2/22]
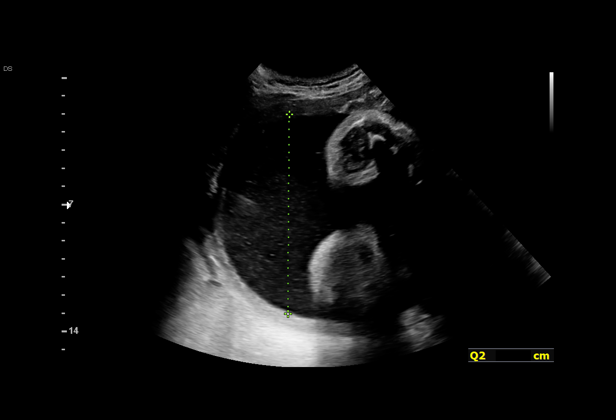
[im 4/22]
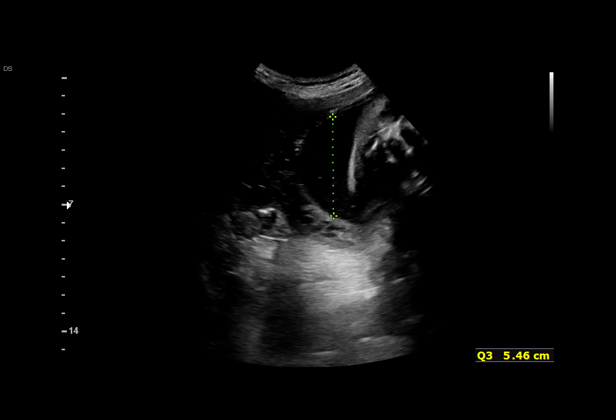
[im 6/22]
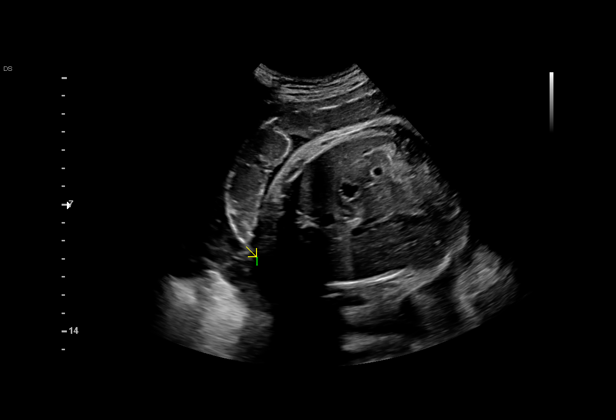
[im 8/22]
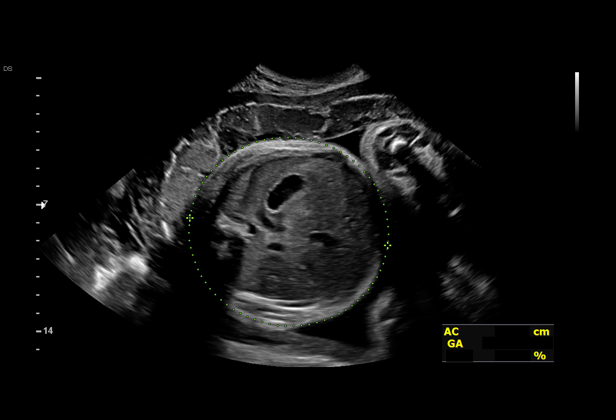
[im 10/22]
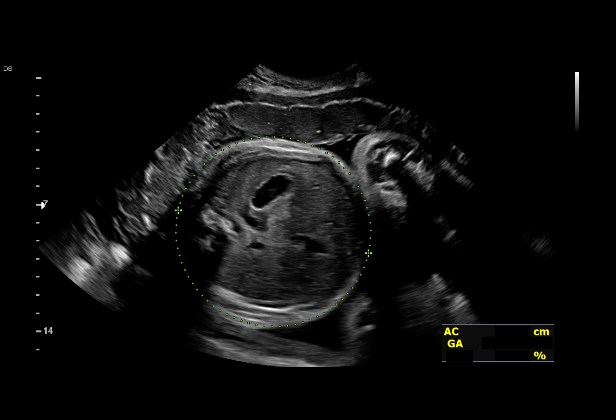
[im 13/22]
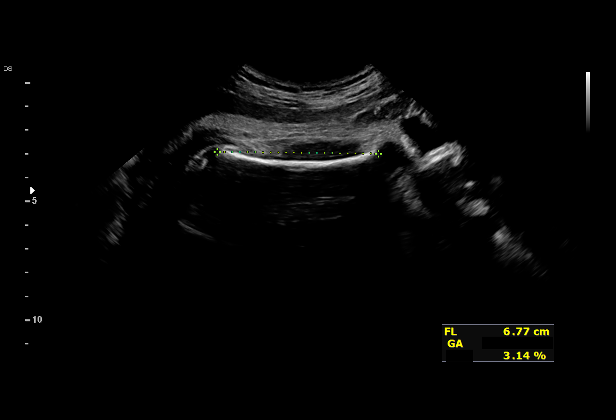
[im 15/22]
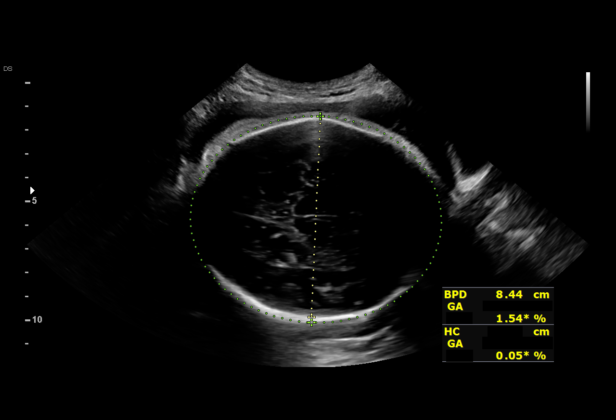
[im 17/22]
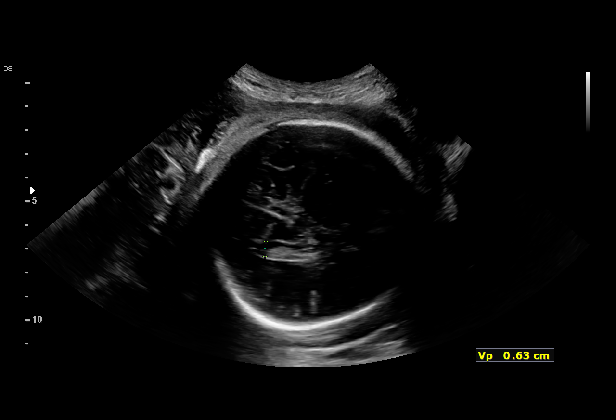
[im 19/22]
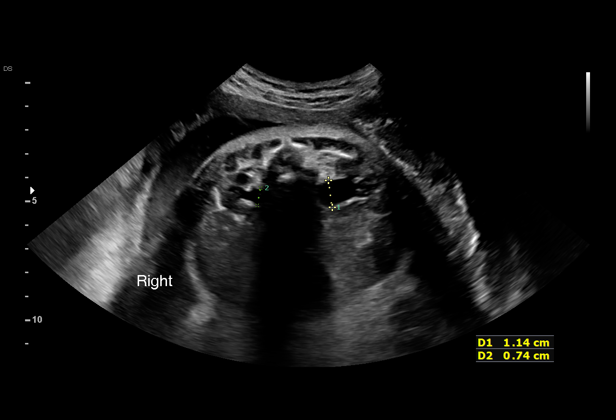
[im 21/22]
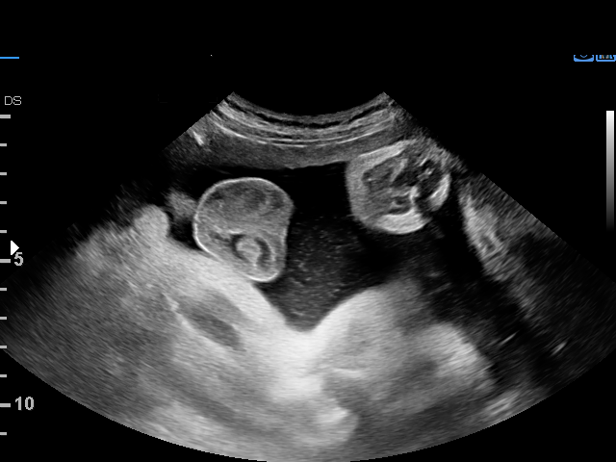

[10 of 22 positions shown; findings below may reference images not displayed]

Addendum:\.br----------------------------------------------------------------------
----------------------------------------------------------------------

----------------------------------------------------------------------

----------------------------------------------------------------------

----------------------------------------------------------------------

----------------------------------------------------------------------
Indications

 Pyelectasis of fetus on prenatal ultrasound
 Poor obstetric history: Previous preterm
 delivery, antepartum
 Cervical incompetence, third trimester
 Low risk NIPS, neg AFP, neg CF carrier
 37 weeks gestation of pregnancy
 Tobacco use complicating pregnancy, third
 trimester
----------------------------------------------------------------------
Fetal Evaluation

 Num Of Fetuses:         1
 Cardiac Activity:       Observed
 Presentation:           Cephalic
 Placenta:               Anterior
 P. Cord Insertion:      Previously Visualized

 Amniotic Fluid
 AFI FV:      Polyhydramnios

 AFI Sum(cm)     %Tile       Largest Pocket(cm)
 24.[REDACTED]

 RUQ(cm)       RLQ(cm)       LUQ(cm)        LLQ(cm)


 Comment:    Placenta is Grade III - Patient does smoke cigarettes
----------------------------------------------------------------------
Biometry
 BPD:        84  mm     G. Age:  33w 6d        1.1  %    CI:        78.07   %    70 - 86
                                                         FL/HC:      22.5   %    20.9 -
 HC:      300.8  mm     G. Age:  33w 3d        < 1  %    HC/AC:      0.91        0.92 -
 AC:       332   mm     G. Age:  37w 1d         53  %    FL/BPD:     80.6   %    71 - 87
 FL:       67.7  mm     G. Age:  34w 6d          3  %    FL/AC:      20.4   %    20 - 24

 LV:        6.3  mm

 Est. FW:    8435  gm      6 lb 1 oz     16  %
----------------------------------------------------------------------
OB History

 Gravidity:    6         Term:   0        Prem:   1        SAB:   3
 TOP:          1        Living:  1
----------------------------------------------------------------------
Gestational Age

 LMP:           37w 4d        Date:  05/17/20                 EDD:   02/21/21
 Clinical EDD:  37w 4d                                        EDD:   02/21/21
 U/S Today:     34w 6d                                        EDD:   03/12/21
 Best:          37w 4d     Det. By:  LMP  (05/17/20)          EDD:   02/21/21
----------------------------------------------------------------------
Anatomy

 Cranium:               Appears normal         Stomach:                Appears normal, left
                                                                       sided
 Cavum:                 Appears normal         Kidneys:                Bilateral
                                                                       pyelectasis
 Ventricles:            Appears normal         Bladder:                Appears normal
 Heart:                 Appears normal
                        (4CH, axis, and
                        situs)

 Other:  Rt renal pelvis 7.4 mm, Lt renal pelvis 11.6 mm
----------------------------------------------------------------------
Impression

 Follow up growth due to prior IUGR
 Normal interval growth with measurements consistent with
 dates
 Good fetal movement and amniotic fluid volume

 Renal pyelectasis Rt 7.4 mm and Lt 11.6 mm persist today. I
 discussed with Ms. Suzuki that postnatal follow up is
 recommended.

 I discussed with Ms. Suzuki the diagnosis of
 polyhydramnios defined as the MVP > 8 or AFI >24 cm. We
 discussed the common etiologies include idiopathic,
 aneupolidy, gastrointestinal conditions, cranial facial defects,
 and neuromuscular conditions. She has a normal 1 hr GTT.
 When the AFI is > 30 or > 11.9  we recommend weekly
 testing however, given mild polyhydramnios is observed we
 recommend [facility] Bozicek and delivery at 39 weeks.
----------------------------------------------------------------------
Recommendations

 Follow up as clinically indicated
 Follow up on renal pyelectasis in the postnatal period.
 Consider delivery at 39 weeks given polyhydramnios
 Wander Times reviewed and advised.
----------------------------------------------------------------------
----------------------------------------------------------------------

*** End of Addendum ***
Addendum:\.br----------------------------------------------------------------------
----------------------------------------------------------------------

----------------------------------------------------------------------

----------------------------------------------------------------------

----------------------------------------------------------------------

----------------------------------------------------------------------
Indications

 Pyelectasis of fetus on prenatal ultrasound
 Poor obstetric history: Previous preterm
 delivery, antepartum
 Cervical incompetence, third trimester
 Low risk NIPS, neg AFP, neg CF carrier
 37 weeks gestation of pregnancy
 Tobacco use complicating pregnancy, third
 trimester
----------------------------------------------------------------------
Fetal Evaluation

 Num Of Fetuses:         1
 Cardiac Activity:       Observed
 Presentation:           Cephalic
 Placenta:               Anterior
 P. Cord Insertion:      Previously Visualized

 Amniotic Fluid
 AFI FV:      Polyhydramnios

 AFI Sum(cm)     %Tile       Largest Pocket(cm)
 24.[REDACTED]

 RUQ(cm)       RLQ(cm)       LUQ(cm)        LLQ(cm)


 Comment:    Placenta is Grade III - Patient does smoke cigarettes
----------------------------------------------------------------------
Biometry
 BPD:        84  mm     G. Age:  33w 6d        1.1  %    CI:        78.07   %    70 - 86
                                                         FL/HC:      22.5   %    20.9 -
 HC:      300.8  mm     G. Age:  33w 3d        < 1  %    HC/AC:      0.91        0.92 -
 AC:       332   mm     G. Age:  37w 1d         53  %    FL/BPD:     80.6   %    71 - 87
 FL:       67.7  mm     G. Age:  34w 6d          3  %    FL/AC:      20.4   %    20 - 24

 LV:        6.3  mm

 Est. FW:    8435  gm      6 lb 1 oz     16  %
----------------------------------------------------------------------
OB History

 Gravidity:    6         Term:   0        Prem:   1        SAB:   3
 TOP:          1        Living:  1
----------------------------------------------------------------------
Gestational Age

 LMP:           37w 4d        Date:  05/17/20                 EDD:   02/21/21
 Clinical EDD:  37w 4d                                        EDD:   02/21/21
 U/S Today:     34w 6d                                        EDD:   03/12/21
 Best:          37w 4d     Det. By:  LMP  (05/17/20)          EDD:   02/21/21
----------------------------------------------------------------------
Anatomy

 Cranium:               Appears normal         Stomach:                Appears normal, left
                                                                       sided
 Cavum:                 Appears normal         Kidneys:                Bilateral
                                                                       pyelectasis
 Ventricles:            Appears normal         Bladder:                Appears normal
 Heart:                 Appears normal
                        (4CH, axis, and
                        situs)

 Other:  Rt renal pelvis 7.4 mm, Lt renal pelvis 11.6 mm
----------------------------------------------------------------------
Impression

 Follow up growth due to prior IUGR
 Normal interval growth with measurements consistent with
 dates
 Good fetal movement and amniotic fluid volume

 Renal pyelectasis Rt 7.4 mm and Lt 11.6 mm persist today. I
 discussed with Ms. Suzuki that postnatal follow up is
 recommended.

 I discussed with Ms. Suzuki the diagnosis of
 polyhydramnios defined as the MVP > 8 or AFI >24 cm. We
 discussed the common etiologies include idiopathic,
 aneupolidy, gastrointestinal conditions, cranial facial defects,
 and neuromuscular conditions. She has a normal 1 hr GTT,
 NIPS and  negative horizon. Genetic screening not performed
 but there were no previous markers of aneuploidy observed.
 When the AFI is > 30 or > 11.9  we recommend weekly
 testing however, given mild polyhydramnios is observed we
 recommend [facility] Bozicek and delivery at 39 weeks.
----------------------------------------------------------------------
Recommendations

 Follow up as clinically indicated
 Follow up on renal pyelectasis in the postnatal period.
 Consider delivery at 39 weeks given polyhydramnios
 Wander Times reviewed and advised.
----------------------------------------------------------------------
----------------------------------------------------------------------

*** End of Addendum ***\.br----------------------------------------------------------------------
----------------------------------------------------------------------

----------------------------------------------------------------------

----------------------------------------------------------------------

----------------------------------------------------------------------

----------------------------------------------------------------------
Indications

 Pyelectasis of fetus on prenatal ultrasound
 Poor obstetric history: Previous preterm
 delivery, antepartum
 Cervical incompetence, third trimester
 Low risk NIPS, neg AFP, neg CF carrier
 37 weeks gestation of pregnancy
 Tobacco use complicating pregnancy, third
 trimester
----------------------------------------------------------------------
Fetal Evaluation

 Num Of Fetuses:         1
 Cardiac Activity:       Observed
 Presentation:           Cephalic
 Placenta:               Anterior
 P. Cord Insertion:      Previously Visualized

 Amniotic Fluid
 AFI FV:      Polyhydramnios

 AFI Sum(cm)     %Tile       Largest Pocket(cm)
 24.[REDACTED]

 RUQ(cm)       RLQ(cm)       LUQ(cm)        LLQ(cm)


 Comment:    Placenta is Grade III - Patient does smoke cigarettes
----------------------------------------------------------------------
Biometry
 BPD:        84  mm     G. Age:  33w 6d        1.1  %    CI:        78.07   %    70 - 86
                                                         FL/HC:      22.5   %    20.9 -
 HC:      300.8  mm     G. Age:  33w 3d        < 1  %    HC/AC:      0.91        0.92 -
 AC:       332   mm     G. Age:  37w 1d         53  %    FL/BPD:     80.6   %    71 - 87
 FL:       67.7  mm     G. Age:  34w 6d          3  %    FL/AC:      20.4   %    20 - 24

 LV:        6.3  mm

 Est. FW:    8435  gm      6 lb 1 oz     16  %
----------------------------------------------------------------------
OB History

 Gravidity:    6         Term:   0        Prem:   1        SAB:   3
 TOP:          1        Living:  1
----------------------------------------------------------------------
Gestational Age

 LMP:           37w 4d        Date:  05/17/20                 EDD:   02/21/21
 Clinical EDD:  37w 4d                                        EDD:   02/21/21
 U/S Today:     34w 6d                                        EDD:   03/12/21
 Best:          37w 4d     Det. By:  LMP  (05/17/20)          EDD:   02/21/21
----------------------------------------------------------------------
Anatomy

 Cranium:               Appears normal         Stomach:                Appears normal, left
                                                                       sided
 Cavum:                 Appears normal         Kidneys:                Bilateral
                                                                       pyelectasis
 Ventricles:            Appears normal         Bladder:                Appears normal
 Heart:                 Appears normal
                        (4CH, axis, and
                        situs)

 Other:  Rt renal pelvis 7.4 mm, Lt renal pelvis 11.6 mm
----------------------------------------------------------------------
Impression

 Follow up growth due to prior IUGR
 Normal interval growth with measurements consistent with
 dates
 Good fetal movement and amniotic fluid volume

 Renal pyelectasis Rt 7.4 mm and Lt 11.6 mm persist today. I
 discussed with Ms. Suzuki that postnatal follow up is
 recommended.

 I discussed with Ms. Suzuki the diagnosis of
 polyhydramnios defined as the MVP > 8 or AFI >24 cm. We
 discussed the common etiologies include idiopathic,
 aneupolidy, gastrointestinal conditions, cranial facial defects,
 and neuromuscular conditions. She has a normal 1 hr GTT,
 NIPS and  negative horizon. Genetic screening not performed
 but there were no previous markers of aneuploidy observed.
 When the AFI is > 30 or > 11.9  we recommend weekly
 testing however, given mild polyhydramnios is observed we
 recommend [facility] Bozicek and delivery at 39 weeks.
----------------------------------------------------------------------
Recommendations

 Follow up as clinically indicated
 Follow up on renal pyelectasis in the postnatal period.
----------------------------------------------------------------------
----------------------------------------------------------------------

## 2021-09-14 DIAGNOSIS — Z419 Encounter for procedure for purposes other than remedying health state, unspecified: Secondary | ICD-10-CM | POA: Diagnosis not present

## 2021-10-15 DIAGNOSIS — Z419 Encounter for procedure for purposes other than remedying health state, unspecified: Secondary | ICD-10-CM | POA: Diagnosis not present

## 2021-11-14 DIAGNOSIS — Z419 Encounter for procedure for purposes other than remedying health state, unspecified: Secondary | ICD-10-CM | POA: Diagnosis not present

## 2021-12-15 DIAGNOSIS — Z419 Encounter for procedure for purposes other than remedying health state, unspecified: Secondary | ICD-10-CM | POA: Diagnosis not present

## 2022-01-14 DIAGNOSIS — Z419 Encounter for procedure for purposes other than remedying health state, unspecified: Secondary | ICD-10-CM | POA: Diagnosis not present

## 2022-02-14 DIAGNOSIS — Z419 Encounter for procedure for purposes other than remedying health state, unspecified: Secondary | ICD-10-CM | POA: Diagnosis not present

## 2022-03-07 DIAGNOSIS — Z7689 Persons encountering health services in other specified circumstances: Secondary | ICD-10-CM | POA: Diagnosis not present

## 2022-03-17 DIAGNOSIS — Z419 Encounter for procedure for purposes other than remedying health state, unspecified: Secondary | ICD-10-CM | POA: Diagnosis not present

## 2022-04-16 DIAGNOSIS — Z419 Encounter for procedure for purposes other than remedying health state, unspecified: Secondary | ICD-10-CM | POA: Diagnosis not present

## 2022-05-11 ENCOUNTER — Encounter: Payer: Self-pay | Admitting: General Practice

## 2022-05-11 ENCOUNTER — Other Ambulatory Visit (HOSPITAL_COMMUNITY)
Admission: RE | Admit: 2022-05-11 | Discharge: 2022-05-11 | Disposition: A | Discharge status: Home / Self Care | Payer: Medicaid Other | Source: Ambulatory Visit | Attending: Family Medicine | Admitting: Family Medicine

## 2022-05-11 ENCOUNTER — Ambulatory Visit (INDEPENDENT_AMBULATORY_CARE_PROVIDER_SITE_OTHER): Payer: Medicaid Other | Admitting: Family Medicine

## 2022-05-11 ENCOUNTER — Encounter: Payer: Self-pay | Admitting: Family Medicine

## 2022-05-11 VITALS — BP 101/60 | HR 87 | Wt 108.0 lb

## 2022-05-11 DIAGNOSIS — Z31430 Encounter of female for testing for genetic disease carrier status for procreative management: Secondary | ICD-10-CM

## 2022-05-11 DIAGNOSIS — F17200 Nicotine dependence, unspecified, uncomplicated: Secondary | ICD-10-CM | POA: Diagnosis not present

## 2022-05-11 DIAGNOSIS — Z7689 Persons encountering health services in other specified circumstances: Secondary | ICD-10-CM | POA: Diagnosis not present

## 2022-05-11 DIAGNOSIS — Z3A16 16 weeks gestation of pregnancy: Secondary | ICD-10-CM | POA: Diagnosis not present

## 2022-05-11 DIAGNOSIS — O0930 Supervision of pregnancy with insufficient antenatal care, unspecified trimester: Secondary | ICD-10-CM | POA: Insufficient documentation

## 2022-05-11 DIAGNOSIS — O0932 Supervision of pregnancy with insufficient antenatal care, second trimester: Secondary | ICD-10-CM | POA: Diagnosis not present

## 2022-05-11 DIAGNOSIS — Z3482 Encounter for supervision of other normal pregnancy, second trimester: Secondary | ICD-10-CM

## 2022-05-11 DIAGNOSIS — Z3480 Encounter for supervision of other normal pregnancy, unspecified trimester: Secondary | ICD-10-CM | POA: Insufficient documentation

## 2022-05-11 NOTE — Progress Notes (Signed)
DATING AND VIABILITY SONOGRAM   Elizabeth [name] is a 26 y.o. year old [phone] with LMP Patient's last menstrual period was 01/19/2022. which would correlate to  [redacted]w[redacted]d weeks gestation.  She has regular menstrual cycles.   She is here today for a confirmatory initial sonogram.    GESTATION: [place]     FETAL ACTIVITY:          Heart rate         161 bpm          The fetus is active.    GESTATIONAL AGE AND  BIOMETRICS:  Gestational criteria: Estimated Date of Delivery: 10/26/22 by LMP now at [redacted]w[redacted]d  Previous Scans:0      Femur length           2.14 cm         16.3 weeks   2.17 cm 16.3 weeks                                                                           AVERAGE EGA(BY THIS SCAN):  16.3 weeks  WORKING EDD( LMP ):  10/26/2022     TECHNICIAN COMMENTS:  Patient informed that the ultrasound is considered a limited obstetric ultrasound and is not intended to be a complete ultrasound exam.  Patient also informed that the ultrasound is not being completed with the intent of assessing for fetal or placental anomalies or any pelvic abnormalities. Explained that the purpose of today's ultrasound is to assess for fetal heart rate.  Patient acknowledges the purpose of the exam and the limitations of the study.     Kathrene Alu 05/11/2022 9:00 AM

## 2022-05-11 NOTE — Addendum Note (Signed)
Addended by: Phill Myron on: 05/11/2022 11:48 AM   Modules accepted: Orders

## 2022-05-11 NOTE — Progress Notes (Signed)
Pt presents for NOB visit.  LMP: June or July  Last PAP unknown Pt is currently smoking 4 cigarettes per day. States she is trying to stop.

## 2022-05-11 NOTE — Addendum Note (Signed)
Addended by: Phill Myron on: 05/11/2022 10:35 AM   Modules accepted: Orders

## 2022-05-11 NOTE — Progress Notes (Signed)
Subjective:  Kc Sedlak is a [id number] [redacted]w[redacted]d by LMP c/w [place] today, being seen today for her first obstetrical visit.  Her obstetrical history is significant for  2 prior SVD. No complications to pregnancy or delivery . She does smoke - has been trying to cut back. Patient does intend to breast feed. Pregnancy history fully reviewed.  Patient reports nausea.  BP 101/60   Pulse 87   Wt 108 lb (49 kg)   LMP 01/19/2022   BMI 18.54 kg/m   HISTORY: OB History  Gravida Para Term Preterm AB Living  6 2 1 1 3 2   SAB IAB Ectopic Multiple Live Births  2 1   0 2    # Outcome Date GA Lbr Len/2nd Weight Sex Delivery Anes PTL Lv  6 Current           5 Term 02/16/21 [redacted]w[redacted]d 06:50 / 01:53 6 lb 2.1 oz (2.78 kg) M Vag-Spont EPI  LIV  4 Preterm 01/25/15 [redacted]w[redacted]d  6 lb 1 oz (2.75 kg) F         Birth Comments: Cx change started at 19 wks     Complications: Incompetent cervix  3 IAB           2 SAB  [redacted]w[redacted]d            Birth Comments: Never went to doctor  1 SAB  [redacted]w[redacted]d            Birth Comments: never went to doctor    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  HENT:  Normocephalic, atraumatic, External right and left ear normal. Oropharynx is clear and moist EYES: Conjunctivae and EOM are normal. Pupils are equal, round, and reactive to light. No scleral icterus.  NECK: Normal range of motion, supple, no masses.  Normal thyroid.  CARDIOVASCULAR: Normal heart rate noted,  regular rhythm RESPIRATORY: Clear to auscultation bilaterally. Effort and breath sounds normal, no problems with respiration noted. BREASTS: Symmetric in size. No masses, skin changes, nipple drainage, or lymphadenopathy. ABDOMEN: Soft, normal bowel sounds, no distention noted.  No tenderness, rebound or guarding.  PELVIC: Normal appearing external genitalia; normal appearing vaginal mucosa and cervix. No abnormal discharge noted. Normal uterine size, no other palpable masses, no uterine or adnexal tenderness. MUSCULOSKELETAL: Normal range of motion. No tenderness.  No cyanosis, clubbing, or edema.  2+ distal pulses. SKIN: Skin is warm and dry. No rash noted. Not diaphoretic. No erythema. No pallor. NEUROLOGIC: Alert and oriented to person, place, and time. Normal reflexes, muscle tone coordination. No cranial nerve deficit noted. PSYCHIATRIC: Normal mood and affect. Normal behavior. Normal judgment and thought content.    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 Supervision of other normal pregnancy, antepartum - CBC/D/Plt+RPR+Rh+ABO+RubIgG... - Urine Culture - [place] MFM OB DETAIL +14 WK; Future  2. Smoker Encouraged patient to continue to cut back - CBC/D/Plt+RPR+Rh+ABO+RubIgG... - Urine Culture - [place] MFM OB DETAIL +14 WK; Future  3. Late prenatal care affecting pregnancy, antepartum - CBC/D/Plt+RPR+Rh+ABO+RubIgG... - Urine Culture - [place] MFM OB DETAIL +14 WK; Future  4. Encounter of female for testing for genetic disease carrier status for procreative management  - Horizon 4 (SMA, CF, FRAGILE X, DMD)    Initial labs obtained Continue prenatal vitamins Reviewed n/v relief measures and warning s/s to report Reviewed recommended weight gain based on pre-gravid BMI Encouraged well-balanced diet Genetic & carrier screening  discussed: requests Panorama and Horizon ,  Ultrasound discussed; fetal survey: requested [place] completed> form faxed if has or is planning to apply for medicaid The nature of [place] for [employer] with multiple MDs and other Advanced Practice Providers was explained to patient; also emphasized that fellows, residents, and students are part of our team.     Problem list reviewed and updated. 75% of 30 min visit spent on counseling and coordination of care.     Truett Mainland 05/11/2022

## 2022-05-12 ENCOUNTER — Encounter: Payer: Medicaid Other | Admitting: Family Medicine

## 2022-05-12 LAB — CYTOLOGY - PAP
Chlamydia: NEGATIVE
Comment: NEGATIVE
Comment: NORMAL
Diagnosis: NEGATIVE
Neisseria Gonorrhea: NEGATIVE

## 2022-05-13 LAB — AFP, SERUM, OPEN SPINA BIFIDA
AFP MoM: 0.15
AFP Value: 6.5 ng/mL
Gest. Age on Collection Date: 16 weeks
Maternal Age At EDD: 27.8 yr
OSBR Risk 1 IN: 10000
Test Results:: NEGATIVE
Weight: 108 [lb_av]

## 2022-05-14 LAB — URINE CULTURE

## 2022-05-16 ENCOUNTER — Other Ambulatory Visit: Payer: Medicaid Other

## 2022-05-16 DIAGNOSIS — Z7689 Persons encountering health services in other specified circumstances: Secondary | ICD-10-CM | POA: Diagnosis not present

## 2022-05-16 DIAGNOSIS — Z3480 Encounter for supervision of other normal pregnancy, unspecified trimester: Secondary | ICD-10-CM | POA: Diagnosis not present

## 2022-05-16 NOTE — Progress Notes (Signed)
Patient sent to lab for redraw. Kathrene Alu RN

## 2022-05-17 ENCOUNTER — Encounter: Payer: Self-pay | Admitting: Family Medicine

## 2022-05-17 DIAGNOSIS — Z419 Encounter for procedure for purposes other than remedying health state, unspecified: Secondary | ICD-10-CM | POA: Diagnosis not present

## 2022-05-17 LAB — CBC/D/PLT+RPR+RH+ABO+RUBIGG...
Antibody Screen: NEGATIVE
Basophils Absolute: 0 10*3/uL (ref 0.0–0.2)
Basos: 0 %
EOS (ABSOLUTE): 0.1 10*3/uL (ref 0.0–0.4)
Eos: 1 %
HCV Ab: NONREACTIVE
HIV Screen 4th Generation wRfx: NONREACTIVE
Hematocrit: 35.6 % (ref 34.0–46.6)
Hemoglobin: 11.8 g/dL (ref 11.1–15.9)
Hepatitis B Surface Ag: NEGATIVE
Immature Grans (Abs): 0 10*3/uL (ref 0.0–0.1)
Immature Granulocytes: 0 %
Lymphocytes Absolute: 1.1 10*3/uL (ref 0.7–3.1)
Lymphs: 20 %
MCH: 29.6 pg (ref 26.6–33.0)
MCHC: 33.1 g/dL (ref 31.5–35.7)
MCV: 89 fL (ref 79–97)
Monocytes Absolute: 0.3 10*3/uL (ref 0.1–0.9)
Monocytes: 5 %
Neutrophils Absolute: 4.1 10*3/uL (ref 1.4–7.0)
Neutrophils: 74 %
Platelets: 171 10*3/uL (ref 150–450)
RBC: 3.98 x10E6/uL (ref 3.77–5.28)
RDW: 13.3 % (ref 11.7–15.4)
RPR Ser Ql: NONREACTIVE
Rh Factor: POSITIVE
Rubella Antibodies, IGG: 1.81 index (ref >0.99)
WBC: 5.7 10*3/uL (ref 3.4–10.8)

## 2022-05-17 LAB — HCV INTERPRETATION

## 2022-05-18 DIAGNOSIS — Z7689 Persons encountering health services in other specified circumstances: Secondary | ICD-10-CM | POA: Diagnosis not present

## 2022-05-18 DIAGNOSIS — F4312 Post-traumatic stress disorder, chronic: Secondary | ICD-10-CM | POA: Diagnosis not present

## 2022-05-18 LAB — PANORAMA PRENATAL TEST FULL PANEL:PANORAMA TEST PLUS 5 ADDITIONAL MICRODELETIONS: FETAL FRACTION: 12.7

## 2022-05-18 NOTE — Addendum Note (Signed)
Addended by: Truett Mainland on: 05/18/2022 11:57 AM   Modules accepted: Orders

## 2022-05-21 LAB — HORIZON 4 (SMA, CF, FRAGILE X, DMD)
CYSTIC FIBROSIS: NEGATIVE
DUCHENNE/BECKER MUSCULAR DYSTROPHY: NEGATIVE
FRAGILE X SYNDROME: NEGATIVE
REPORT SUMMARY: NEGATIVE
SPINAL MUSCULAR ATROPHY: NEGATIVE

## 2022-06-13 ENCOUNTER — Ambulatory Visit: Payer: Medicaid Other

## 2022-06-13 ENCOUNTER — Ambulatory Visit: Payer: Medicaid Other | Attending: Family Medicine

## 2022-06-13 ENCOUNTER — Ambulatory Visit (INDEPENDENT_AMBULATORY_CARE_PROVIDER_SITE_OTHER): Payer: Medicaid Other | Admitting: Advanced Practice Midwife

## 2022-06-13 ENCOUNTER — Other Ambulatory Visit: Payer: Self-pay

## 2022-06-13 VITALS — BP 99/59 | HR 67

## 2022-06-13 VITALS — BP 99/58 | HR 70 | Wt 114.0 lb

## 2022-06-13 DIAGNOSIS — O99332 Smoking (tobacco) complicating pregnancy, second trimester: Secondary | ICD-10-CM | POA: Diagnosis not present

## 2022-06-13 DIAGNOSIS — Z363 Encounter for antenatal screening for malformations: Secondary | ICD-10-CM | POA: Insufficient documentation

## 2022-06-13 DIAGNOSIS — Z7689 Persons encountering health services in other specified circumstances: Secondary | ICD-10-CM | POA: Diagnosis not present

## 2022-06-13 DIAGNOSIS — F17200 Nicotine dependence, unspecified, uncomplicated: Secondary | ICD-10-CM | POA: Diagnosis not present

## 2022-06-13 DIAGNOSIS — Z3482 Encounter for supervision of other normal pregnancy, second trimester: Secondary | ICD-10-CM

## 2022-06-13 DIAGNOSIS — Z3A21 21 weeks gestation of pregnancy: Secondary | ICD-10-CM | POA: Diagnosis not present

## 2022-06-13 DIAGNOSIS — O358XX0 Maternal care for other (suspected) fetal abnormality and damage, not applicable or unspecified: Secondary | ICD-10-CM | POA: Diagnosis not present

## 2022-06-13 DIAGNOSIS — Z3480 Encounter for supervision of other normal pregnancy, unspecified trimester: Secondary | ICD-10-CM | POA: Diagnosis not present

## 2022-06-13 DIAGNOSIS — O283 Abnormal ultrasonic finding on antenatal screening of mother: Secondary | ICD-10-CM

## 2022-06-13 DIAGNOSIS — Z362 Encounter for other antenatal screening follow-up: Secondary | ICD-10-CM

## 2022-06-13 DIAGNOSIS — O0930 Supervision of pregnancy with insufficient antenatal care, unspecified trimester: Secondary | ICD-10-CM | POA: Insufficient documentation

## 2022-06-13 DIAGNOSIS — O0932 Supervision of pregnancy with insufficient antenatal care, second trimester: Secondary | ICD-10-CM | POA: Insufficient documentation

## 2022-06-13 DIAGNOSIS — Z3A20 20 weeks gestation of pregnancy: Secondary | ICD-10-CM

## 2022-06-13 NOTE — Progress Notes (Signed)
   PRENATAL VISIT NOTE  Subjective:  Elizabeth [name] is a 26 y.o. [id number] at [redacted]w[redacted]d being seen today for ongoing prenatal care.  She is currently monitored for the following issues for this low-risk pregnancy and has HSV infection and Supervision of other normal pregnancy, antepartum on their problem list.  Patient reports no complaints.  Contractions: Not present. Vag. Bleeding: None.  Movement: Present. Denies leaking of fluid.   The following portions of the patient's history were reviewed and updated as appropriate: allergies, current medications, past family history, past medical history, past social history, past surgical history and problem list.   Objective:   Vitals:   06/13/22 1037  BP: (!) 99/58  Pulse: 70  Weight: 114 lb (51.7 kg)    Fetal Status: Fetal Heart Rate (bpm): 139   Movement: Present     General:  Alert, oriented and cooperative. Patient is in no acute distress.  Skin: Skin is warm and dry. No rash noted.   Cardiovascular: Normal heart rate noted  Respiratory: Normal respiratory effort, no problems with respiration noted  Abdomen: Soft, gravid, appropriate for gestational age.  Pain/Pressure: Present     Pelvic: Cervical exam deferred        Extremities: Normal range of motion.  Edema: None  Mental Status: Normal mood and affect. Normal behavior. Normal judgment and thought content.   Assessment and Plan:  Pregnancy: [id number] at 100w3d 1. [redacted] weeks gestation of pregnancy     Has [place] appt today  Preterm labor symptoms and general obstetric precautions including but not limited to vaginal bleeding, contractions, leaking of fluid and fetal movement were reviewed in detail with the patient. Please refer to After Visit Summary for other counseling recommendations.     Future Appointments  Date Time Provider Department Center  07/07/2022 11:15 AM Constant, Gigi Gin, MD [facility] None  07/13/2022  1:15 PM [facility] NURSE [facility]  07/13/2022  1:30 PM [facility] US3  [facility]  08/03/2022  8:35 AM Stinson, Rhona Raider, DO [facility] None    Wynelle Bourgeois, CNM

## 2022-06-16 DIAGNOSIS — Z419 Encounter for procedure for purposes other than remedying health state, unspecified: Secondary | ICD-10-CM | POA: Diagnosis not present

## 2022-06-19 DIAGNOSIS — F4312 Post-traumatic stress disorder, chronic: Secondary | ICD-10-CM | POA: Diagnosis not present

## 2022-06-19 DIAGNOSIS — Z7689 Persons encountering health services in other specified circumstances: Secondary | ICD-10-CM | POA: Diagnosis not present

## 2022-07-07 ENCOUNTER — Encounter: Payer: Self-pay | Admitting: Obstetrics and Gynecology

## 2022-07-07 ENCOUNTER — Telehealth (INDEPENDENT_AMBULATORY_CARE_PROVIDER_SITE_OTHER): Payer: Medicaid Other | Admitting: Obstetrics and Gynecology

## 2022-07-07 DIAGNOSIS — O9882 Other maternal infectious and parasitic diseases complicating childbirth: Secondary | ICD-10-CM

## 2022-07-07 DIAGNOSIS — B009 Herpesviral infection, unspecified: Secondary | ICD-10-CM

## 2022-07-07 DIAGNOSIS — Z3480 Encounter for supervision of other normal pregnancy, unspecified trimester: Secondary | ICD-10-CM

## 2022-07-07 DIAGNOSIS — Z3A24 24 weeks gestation of pregnancy: Secondary | ICD-10-CM

## 2022-07-07 NOTE — Progress Notes (Signed)
OBSTETRICS PRENATAL VIRTUAL VISIT ENCOUNTER NOTE  Provider location: Center for [facility]   Patient location: Home  I connected with Elizabeth [name] on 07/07/22 at 11:15 AM EST by MyChart Video Encounter and verified that I am speaking with the correct person using two identifiers. I discussed the limitations, risks, security and privacy concerns of performing an evaluation and management service virtually and the availability of in person appointments. I also discussed with the patient that there may be a patient responsible charge related to this service. The patient expressed understanding and agreed to proceed. Subjective:  Elizabeth [name] is a 26 y.o. [id number] at [redacted]w[redacted]d being seen today for ongoing prenatal care.  She is currently monitored for the following issues for this low-risk pregnancy and has HSV infection and Supervision of other normal pregnancy, antepartum on their problem list.  Patient reports no complaints.  Contractions: Not present. Vag. Bleeding: None.  Movement: Present. Denies any leaking of fluid.   The following portions of the patient's history were reviewed and updated as appropriate: allergies, current medications, past family history, past medical history, past social history, past surgical history and problem list.   Objective:  There were no vitals filed for this visit.  Fetal Status:     Movement: Present     General:  Alert, oriented and cooperative. Patient is in no acute distress.  Respiratory: Normal respiratory effort, no problems with respiration noted  Mental Status: Normal mood and affect. Normal behavior. Normal judgment and thought content.  Rest of physical exam deferred due to type of encounter  Imaging: [place] MFM OB DETAIL +14 WK  Result Date: 06/13/2022 ----------------------------------------------------------------------  OBSTETRICS REPORT                    (Corrected Final 06/13/2022 02:05 pm)  ---------------------------------------------------------------------- Patient Info  ID #:       [id number]                          D.O.B.:  [birth year] (26 yrs)  Name:       Elizabeth [name]                Visit Date: 06/13/2022 01:28 pm ---------------------------------------------------------------------- Performed By  Attending:        Braxton Feathers DO       Ref. Address:     [address]                                                             [address]  Performed By:     Emeline Darling BS,      [facility]  Referred By:      [facility] ---------------------------------------------------------------------- Orders  #  Description                           Code        Ordered By  1  US MFM OB DETAIL +14 WK               [id number]    Candelaria CelesteJACOB STINSON ----------------------------------------------------------------------  #  Order #                     Accession #                Episode #  1  [id number]                   [id number]                 [id number] ---------------------------------------------------------------------- Indications  Echogenic intracardiac focus of the heart      O35.8XX0  (EIF)  Tobacco use complicating pregnancy,            O99.332  second trimester  Insufficient Prenatal Care                     O09.30  Encounter for antenatal screening for          Z36.3  malformations  Low Risk NIPS(Negative Horizon)(Negative  AFP)  [redacted] weeks gestation of pregnancy                Z3A.21 ---------------------------------------------------------------------- Vital Signs  BP:          99/59 ---------------------------------------------------------------------- Fetal Evaluation  Num Of Fetuses:         1  Fetal Heart Rate(bpm):  144  Cardiac Activity:       Observed   Presentation:           Transverse, head to maternal left  Placenta:               Posterior  P. Cord Insertion:      Visualized  Amniotic Fluid  AFI FV:      Within normal limits                              Largest Pocket(cm)                              4.3 ---------------------------------------------------------------------- Biometry  BPD:      49.5  mm     G. Age:  21w 0d         30  %    CI:        72.64   %    70 - 86  FL/HC:      21.1   %    15.9 - 20.3  HC:      184.7  mm     G. Age:  20w 6d         17  %    HC/AC:      1.12        1.06 - 1.25  AC:      165.2  mm     G. Age:  21w 4d         47  %    FL/BPD:     78.6   %  FL:       38.9  mm     G. Age:  22w 3d         76  %    FL/AC:      23.5   %    20 - 24  HUM:      35.4  mm     G. Age:  22w 2d         67  %  CER:      22.9  mm     G. Age:  21w 2d         61  %  LV:          6  mm  CM:        4.1  mm  Est. FW:     454  gm           1 lb     66  % ---------------------------------------------------------------------- OB History  Gravidity:    6         Term:   1        Prem:   1        SAB:   2  TOP:          1       Ectopic:  0        Living: 2 ---------------------------------------------------------------------- Gestational Age  LMP:           20w 5d        Date:  01/19/22                   EDD:   10/26/22  U/S Today:     21w 3d                                        EDD:   10/21/22  Best:          [place] 3d     Det. By:  U/S (06/13/22)           EDD:   10/21/22 ---------------------------------------------------------------------- Anatomy  Cranium:               Appears normal         LVOT:                   Appears normal  Cavum:                 Appears normal         Aortic Arch:            Appears normal  Ventricles:            Appears normal         Ductal Arch:  Not well visualized  Choroid Plexus:        Appears normal         Diaphragm:              Appears normal  Cerebellum:             Appears normal         Stomach:                Appears normal, left                                                                        sided  Posterior Fossa:       Appears normal         Abdomen:                Appears normal  Nuchal Fold:           Not applicable (>20    Abdominal Wall:         Appears nml (cord                         wks GA)                                        insert, abd wall)  Face:                  Appears normal         Cord Vessels:           Appears normal (3                         (orbits and profile)                           vessel cord)  Lips:                  Appears normal         Kidneys:                Appear normal  Palate:                Appears normal         Bladder:                Appears normal  Thoracic:              Appears normal         Spine:                  Appears normal  Heart:                 Appears normal; EIF    Upper Extremities:      Appears normal  RVOT:                  Not well visualized    Lower Extremities:      Appears normal  Other:  Fetus appears to  be female. Technically difficult due to fetal position.          Heels not well visualized. ---------------------------------------------------------------------- Targeted Anatomy  Central Nervous System  Midline Falx:          Appears normal  Head/Neck  Nasal Bone:            Present                Mandible:               Appears normal  Orbits/Eyes:           Appears normal         Maxilla:                Not well visualized  Thorax  SVC:                   Appears normal         3 V Trachea View:       Appears normal  3 Vessel View:         Appears normal         IVC:                    Appears normal  Extremities  Lt Hand:               hand nml               Lt Foot:                Not well visualized  Rt Hand:               hand nml               Rt Foot:                Not well visualized ---------------------------------------------------------------------- Cervix Uterus Adnexa  Cervix   Length:            3.5  cm.  Right Ovary  Within normal limits.  Left Ovary  Not visualized.  Adnexa  No abnormality visualized. ---------------------------------------------------------------------- Comments  Ms. Vandehei is at 21w 3d with EDD of 10/21/2022 dated by  U/S (06/13/22) here for a detailed anatomic survey for  tobacco and limited prenatal care. She had low risk NIPS and  normal AFP. She reports her LMP was a best guess and her  cycles are not every 28 days since she was previously on  Depo Provera. We will date by today's ultrasound giving an  EDD of 10/20/21.  Sonographic findings  Single intrauterine pregnancy.  Observed fetal cardiac activity.  Transverse, head to maternal left presentation.  Fetal anatomy that was well seen appears normal without  evidence of soft markers. Not all fetal structures were well  seen due to a technically diffcult exam and the anatomic  survey remains incomplete with limited views of the TVOT,  heels and foot.  Fetal biometry shows the estimated fetal weight at the 66  percentile.  Amniotic fluid volume: Within normal limits.  Placenta: Posterior.  Cervix:  with a cervical length of 3.5 cm.  Adnexa: No abnormality visualized.  Recommendations  - She reports her LMP was a best guess and her cycles are  not every 28 days since she was previously on Depo Provera.  We will date by today's ultrasound giving an EDD of 10/20/21.  - F/u growth in 4 weeks  I discussed  the limitations of prenatal ultrasound with the  patient including inability to detect certain abnormalities and  poor visualization of certain anatomic structures due to fetal  position, gestational age and maternal body habitus.  The  patient verbalized understanding and had time to ask  questions that were answered to her satisfaction. ----------------------------------------------------------------------                       Braxton Feathers, DO Electronically Signed Corrected Final Report  06/13/2022 02:05 pm  ----------------------------------------------------------------------   Assessment and Plan:  Pregnancy: [id number] at [redacted]w[redacted]d 1. Supervision of other normal pregnancy, antepartum Patient is doing well and is without complaints Plans to use current pediatrician Undecided on contraception- considering LARC vs BTL Third trimester labs with glucola next visit  2. HSV infection Prophylaxis at 34-36 weeks  Preterm labor symptoms and general obstetric precautions including but not limited to vaginal bleeding, contractions, leaking of fluid and fetal movement were reviewed in detail with the patient. I discussed the assessment and treatment plan with the patient. The patient was provided an opportunity to ask questions and all were answered. The patient agreed with the plan and demonstrated an understanding of the instructions. The patient was advised to call back or seek an in-person office evaluation/go to [facility] at [facility] for any urgent or concerning symptoms. Please refer to After Visit Summary for other counseling recommendations.   I provided 15 minutes of face-to-face time during this encounter.  Return in about 4 weeks (around 08/04/2022) for in person, ROB, Low risk, 2 hr glucola next visit.  Future Appointments  Date Time Provider Department Center  07/13/2022  1:15 PM [facility] NURSE [facility] [place]  07/13/2022  1:30 PM [facility] US3 [facility]  08/03/2022  8:35 AM Levie Heritage, DO [facility] None  08/24/2022 11:15 AM Levie Heritage, DO [facility] None  09/07/2022 11:15 AM Adrian Blackwater, Rhona Raider, DO [facility] None    Catalina Antigua, MD Center for [facility]

## 2022-07-13 ENCOUNTER — Ambulatory Visit: Payer: Medicaid Other | Attending: Maternal & Fetal Medicine

## 2022-07-13 ENCOUNTER — Other Ambulatory Visit: Payer: Self-pay | Admitting: *Deleted

## 2022-07-13 ENCOUNTER — Ambulatory Visit: Payer: Medicaid Other | Admitting: *Deleted

## 2022-07-13 VITALS — BP 109/57 | HR 77

## 2022-07-13 DIAGNOSIS — Z363 Encounter for antenatal screening for malformations: Secondary | ICD-10-CM | POA: Insufficient documentation

## 2022-07-13 DIAGNOSIS — O283 Abnormal ultrasonic finding on antenatal screening of mother: Secondary | ICD-10-CM | POA: Diagnosis not present

## 2022-07-13 DIAGNOSIS — Z3492 Encounter for supervision of normal pregnancy, unspecified, second trimester: Secondary | ICD-10-CM

## 2022-07-13 DIAGNOSIS — O358XX0 Maternal care for other (suspected) fetal abnormality and damage, not applicable or unspecified: Secondary | ICD-10-CM | POA: Diagnosis not present

## 2022-07-13 DIAGNOSIS — O99332 Smoking (tobacco) complicating pregnancy, second trimester: Secondary | ICD-10-CM

## 2022-07-13 DIAGNOSIS — Z362 Encounter for other antenatal screening follow-up: Secondary | ICD-10-CM

## 2022-07-13 DIAGNOSIS — Z3A25 25 weeks gestation of pregnancy: Secondary | ICD-10-CM | POA: Insufficient documentation

## 2022-07-13 DIAGNOSIS — Z7689 Persons encountering health services in other specified circumstances: Secondary | ICD-10-CM | POA: Diagnosis not present

## 2022-07-13 DIAGNOSIS — O0932 Supervision of pregnancy with insufficient antenatal care, second trimester: Secondary | ICD-10-CM | POA: Diagnosis not present

## 2022-07-13 DIAGNOSIS — F17210 Nicotine dependence, cigarettes, uncomplicated: Secondary | ICD-10-CM

## 2022-07-17 DIAGNOSIS — Z419 Encounter for procedure for purposes other than remedying health state, unspecified: Secondary | ICD-10-CM | POA: Diagnosis not present

## 2022-07-17 NOTE — L&D Delivery Note (Signed)
OB/GYN Faculty Practice Delivery Note  Elizabeth [name] is a 27 y.o. [id number] s/p VD at [redacted]w[redacted]d. She was admitted for IOL s/t FGR.   ROM: 6h 67m with clear fluid GBS Status: Negative Maximum Maternal Temperature: 98.3  Labor Progress: Patient presented for IOL and was AROM'd.  After 4 hours of no progress and after epidural placement, she was started on low dose pitocin.  She progressed to complete and delivered as below.   Delivery Date/Time: Monday October 09, 2022 at 1457 Delivery: Called to room and patient was complete , +4 station with FHR in 70s. Patient instructed to push and delivered head in ROA.  Shoulder and body delivered in usual fashion with double nuchal noted around leg. Infant stimulated, by provider, and placed on mother's abdomen, where nurses continued to dry and stimulate.  Infant with good tone and grimace efforts. Cord clamped x 2 after 2-minute delay, and cut by patient. Cord blood drawn. Placenta delivered spontaneously with gentle cord traction. Vaginal inspection revealed a first degree perineal laceration that was repaired with 3-0 vicryl on CT-1.  Patient also with left periurethral laceration that was hemostatic and left unrepaired. No additional anesthetic necessary and patient tolerated the procedure well. Fundus firm, at the umbilicus, and bleeding small.  Mother hemodynamically stable and infant skin to skin prior to provider exit.  Mother desires depo for birth control.   Placenta: Intact to Pathology Complications: None Lacerations: 1st Degree Perineal, Left Hemostatic Periurethral EBL: 306 Analgesia: Epidural  Postpartum Planning Discharge Summary Shared Postpartum Message Sent Depo Provera Ordered  Infant: Female  APGARs 8, 91  2340g, 5lbs 2.5oz [place] Ihsan Nomura, CNM  10/09/2022 3:48 PM

## 2022-07-20 DIAGNOSIS — F4312 Post-traumatic stress disorder, chronic: Secondary | ICD-10-CM | POA: Diagnosis not present

## 2022-07-20 DIAGNOSIS — Z7689 Persons encountering health services in other specified circumstances: Secondary | ICD-10-CM | POA: Diagnosis not present

## 2022-08-03 ENCOUNTER — Ambulatory Visit (INDEPENDENT_AMBULATORY_CARE_PROVIDER_SITE_OTHER): Payer: Medicaid Other | Admitting: Family Medicine

## 2022-08-03 ENCOUNTER — Encounter: Payer: Self-pay | Admitting: General Practice

## 2022-08-03 ENCOUNTER — Other Ambulatory Visit (HOSPITAL_COMMUNITY)
Admission: RE | Admit: 2022-08-03 | Discharge: 2022-08-03 | Disposition: A | Discharge status: Home / Self Care | Payer: Medicaid Other | Source: Ambulatory Visit | Attending: Family Medicine | Admitting: Family Medicine

## 2022-08-03 VITALS — BP 94/56 | HR 80 | Wt 123.1 lb

## 2022-08-03 DIAGNOSIS — Z3483 Encounter for supervision of other normal pregnancy, third trimester: Secondary | ICD-10-CM | POA: Insufficient documentation

## 2022-08-03 DIAGNOSIS — Z3480 Encounter for supervision of other normal pregnancy, unspecified trimester: Secondary | ICD-10-CM

## 2022-08-03 DIAGNOSIS — Z7689 Persons encountering health services in other specified circumstances: Secondary | ICD-10-CM | POA: Diagnosis not present

## 2022-08-03 DIAGNOSIS — N898 Other specified noninflammatory disorders of vagina: Secondary | ICD-10-CM

## 2022-08-03 DIAGNOSIS — Z23 Encounter for immunization: Secondary | ICD-10-CM

## 2022-08-03 DIAGNOSIS — Z3493 Encounter for supervision of normal pregnancy, unspecified, third trimester: Secondary | ICD-10-CM | POA: Diagnosis not present

## 2022-08-03 DIAGNOSIS — Z3A28 28 weeks gestation of pregnancy: Secondary | ICD-10-CM | POA: Insufficient documentation

## 2022-08-03 DIAGNOSIS — B009 Herpesviral infection, unspecified: Secondary | ICD-10-CM

## 2022-08-03 NOTE — Progress Notes (Signed)
   PRENATAL VISIT NOTE  Subjective:  Elizabeth [name] is a 27 y.o. [id number] at [redacted]w[redacted]d being seen today for ongoing prenatal care.  She is currently monitored for the following issues for this low-risk pregnancy and has HSV infection and Supervision of other normal pregnancy, antepartum on their problem list.  Patient reports  pelvic pressure and vaginal discharge .  Contractions: Irritability. Vag. Bleeding: None.  Movement: Present. Denies leaking of fluid.   The following portions of the patient's history were reviewed and updated as appropriate: allergies, current medications, past family history, past medical history, past social history, past surgical history and problem list.   Objective:   Vitals:   08/03/22 0821  BP: (!) 94/56  Pulse: 80  Weight: 123 lb 1.3 oz (55.8 kg)    Fetal Status:   Fundal Height: 26 cm Movement: Present     General:  Alert, oriented and cooperative. Patient is in no acute distress.  Skin: Skin is warm and dry. No rash noted.   Cardiovascular: Normal heart rate noted  Respiratory: Normal respiratory effort, no problems with respiration noted  Abdomen: Soft, gravid, appropriate for gestational age.  Pain/Pressure: Present     Pelvic: Cervical exam performed in the presence of a chaperone Dilation: Closed Effacement (%): Thick Station: -3  Extremities: Normal range of motion.  Edema: None  Mental Status: Normal mood and affect. Normal behavior. Normal judgment and thought content.   Assessment and Plan:  Pregnancy: [id number] at [redacted]w[redacted]d 1. [redacted] weeks gestation of pregnancy - CBC - Glucose Tolerance, 2 Hours w/1 Hour - HIV Antibody (routine testing w rflx) - RPR - Tdap vaccine greater than or equal to 7yo IM  2. Supervision of other normal pregnancy, antepartum FHT and FH normal  3. HSV infection Ppx at 35 weeks  4. Vaginal discharge Wet prep  Preterm labor symptoms and general obstetric precautions including but not limited to vaginal bleeding,  contractions, leaking of fluid and fetal movement were reviewed in detail with the patient. Please refer to After Visit Summary for other counseling recommendations.   No follow-ups on file.  Future Appointments  Date Time Provider George  08/17/2022  1:30 PM [facility] NURSE [facility]  08/17/2022  1:45 PM [facility] US6 [facility]  08/24/2022 11:15 AM Truett Mainland, DO [facility] None  09/07/2022 11:15 AM Truett Mainland, DO [facility] None    Truett Mainland, DO

## 2022-08-04 LAB — CBC
Hematocrit: 36.2 % (ref 34.0–46.6)
Hemoglobin: 12.2 g/dL (ref 11.1–15.9)
MCH: 30 pg (ref 26.6–33.0)
MCHC: 33.7 g/dL (ref 31.5–35.7)
MCV: 89 fL (ref 79–97)
Platelets: 153 10*3/uL (ref 150–450)
RBC: 4.06 x10E6/uL (ref 3.77–5.28)
RDW: 12.9 % (ref 11.7–15.4)
WBC: 9.6 10*3/uL (ref 3.4–10.8)

## 2022-08-04 LAB — CERVICOVAGINAL ANCILLARY ONLY
Bacterial Vaginitis (gardnerella): NEGATIVE
Candida Glabrata: NEGATIVE
Candida Vaginitis: NEGATIVE
Comment: NEGATIVE
Comment: NEGATIVE
Comment: NEGATIVE

## 2022-08-04 LAB — GLUCOSE TOLERANCE, 2 HOURS W/ 1HR
Glucose, 1 hour: 95 mg/dL (ref 70–179)
Glucose, 2 hour: 78 mg/dL (ref 70–152)
Glucose, Fasting: 77 mg/dL (ref 70–91)

## 2022-08-04 LAB — HIV ANTIBODY (ROUTINE TESTING W REFLEX): HIV Screen 4th Generation wRfx: NONREACTIVE

## 2022-08-04 LAB — RPR: RPR Ser Ql: NONREACTIVE

## 2022-08-17 ENCOUNTER — Ambulatory Visit (HOSPITAL_BASED_OUTPATIENT_CLINIC_OR_DEPARTMENT_OTHER): Payer: Medicaid Other | Admitting: *Deleted

## 2022-08-17 ENCOUNTER — Ambulatory Visit: Payer: Medicaid Other | Admitting: *Deleted

## 2022-08-17 ENCOUNTER — Ambulatory Visit: Payer: Medicaid Other | Attending: Maternal & Fetal Medicine

## 2022-08-17 ENCOUNTER — Encounter: Payer: Self-pay | Admitting: *Deleted

## 2022-08-17 ENCOUNTER — Other Ambulatory Visit: Payer: Self-pay | Admitting: Maternal & Fetal Medicine

## 2022-08-17 ENCOUNTER — Other Ambulatory Visit: Payer: Self-pay | Admitting: *Deleted

## 2022-08-17 VITALS — BP 92/55 | HR 75

## 2022-08-17 DIAGNOSIS — Z362 Encounter for other antenatal screening follow-up: Secondary | ICD-10-CM

## 2022-08-17 DIAGNOSIS — O283 Abnormal ultrasonic finding on antenatal screening of mother: Secondary | ICD-10-CM

## 2022-08-17 DIAGNOSIS — O365930 Maternal care for other known or suspected poor fetal growth, third trimester, not applicable or unspecified: Secondary | ICD-10-CM

## 2022-08-17 DIAGNOSIS — Z3A30 30 weeks gestation of pregnancy: Secondary | ICD-10-CM | POA: Insufficient documentation

## 2022-08-17 DIAGNOSIS — O99333 Smoking (tobacco) complicating pregnancy, third trimester: Secondary | ICD-10-CM | POA: Diagnosis not present

## 2022-08-17 DIAGNOSIS — Z3480 Encounter for supervision of other normal pregnancy, unspecified trimester: Secondary | ICD-10-CM | POA: Insufficient documentation

## 2022-08-17 DIAGNOSIS — O0933 Supervision of pregnancy with insufficient antenatal care, third trimester: Secondary | ICD-10-CM | POA: Insufficient documentation

## 2022-08-17 DIAGNOSIS — O35BXX0 Maternal care for other (suspected) fetal abnormality and damage, fetal cardiac anomalies, not applicable or unspecified: Secondary | ICD-10-CM | POA: Diagnosis not present

## 2022-08-17 DIAGNOSIS — O365990 Maternal care for other known or suspected poor fetal growth, unspecified trimester, not applicable or unspecified: Secondary | ICD-10-CM | POA: Insufficient documentation

## 2022-08-17 DIAGNOSIS — Z7689 Persons encountering health services in other specified circumstances: Secondary | ICD-10-CM | POA: Diagnosis not present

## 2022-08-17 DIAGNOSIS — Z419 Encounter for procedure for purposes other than remedying health state, unspecified: Secondary | ICD-10-CM | POA: Diagnosis not present

## 2022-08-17 DIAGNOSIS — Z363 Encounter for antenatal screening for malformations: Secondary | ICD-10-CM | POA: Insufficient documentation

## 2022-08-17 NOTE — Procedures (Signed)
Elizabeth [name] 06/15/1996 [redacted]w[redacted]d  Fetus A Non-Stress Test Interpretation for 08/17/22  Indication: IUGR  Fetal Heart Rate A Mode: External Baseline Rate (A): 140 bpm Variability: Moderate Accelerations: 10 x 10 Decelerations: None Multiple birth?: No  Uterine Activity Mode: Palpation, Toco Contraction Frequency (min): none Resting Tone Palpated: Relaxed  Interpretation (Fetal Testing) Nonstress Test Interpretation: Reactive Overall Impression: Reassuring for gestational age Comments: Dr. Annamaria Boots reviewed tracing

## 2022-08-23 ENCOUNTER — Ambulatory Visit: Payer: Medicaid Other

## 2022-08-23 ENCOUNTER — Ambulatory Visit: Payer: Medicaid Other | Attending: Obstetrics

## 2022-08-23 VITALS — BP 110/61 | HR 80

## 2022-08-23 DIAGNOSIS — O99333 Smoking (tobacco) complicating pregnancy, third trimester: Secondary | ICD-10-CM

## 2022-08-23 DIAGNOSIS — Z7689 Persons encountering health services in other specified circumstances: Secondary | ICD-10-CM | POA: Diagnosis not present

## 2022-08-23 DIAGNOSIS — O35BXX0 Maternal care for other (suspected) fetal abnormality and damage, fetal cardiac anomalies, not applicable or unspecified: Secondary | ICD-10-CM

## 2022-08-23 DIAGNOSIS — Z3480 Encounter for supervision of other normal pregnancy, unspecified trimester: Secondary | ICD-10-CM | POA: Diagnosis present

## 2022-08-23 DIAGNOSIS — Z3A31 31 weeks gestation of pregnancy: Secondary | ICD-10-CM | POA: Diagnosis not present

## 2022-08-23 DIAGNOSIS — O365930 Maternal care for other known or suspected poor fetal growth, third trimester, not applicable or unspecified: Secondary | ICD-10-CM | POA: Diagnosis not present

## 2022-08-23 DIAGNOSIS — O0933 Supervision of pregnancy with insufficient antenatal care, third trimester: Secondary | ICD-10-CM | POA: Insufficient documentation

## 2022-08-24 ENCOUNTER — Ambulatory Visit (INDEPENDENT_AMBULATORY_CARE_PROVIDER_SITE_OTHER): Payer: Medicaid Other | Admitting: Family Medicine

## 2022-08-24 VITALS — BP 103/54 | HR 78 | Wt 127.0 lb

## 2022-08-24 DIAGNOSIS — Z7689 Persons encountering health services in other specified circumstances: Secondary | ICD-10-CM | POA: Diagnosis not present

## 2022-08-24 DIAGNOSIS — O365990 Maternal care for other known or suspected poor fetal growth, unspecified trimester, not applicable or unspecified: Secondary | ICD-10-CM | POA: Insufficient documentation

## 2022-08-24 DIAGNOSIS — B009 Herpesviral infection, unspecified: Secondary | ICD-10-CM

## 2022-08-24 DIAGNOSIS — O365930 Maternal care for other known or suspected poor fetal growth, third trimester, not applicable or unspecified: Secondary | ICD-10-CM

## 2022-08-24 DIAGNOSIS — Z3480 Encounter for supervision of other normal pregnancy, unspecified trimester: Secondary | ICD-10-CM

## 2022-08-24 NOTE — Progress Notes (Signed)
   PRENATAL VISIT NOTE  Subjective:  Elizabeth [name] is a 27 y.o. [id number] at [redacted]w[redacted]d being seen today for ongoing prenatal care.  She is currently monitored for the following issues for this high-risk pregnancy and has HSV infection; Supervision of other normal pregnancy, antepartum; and Fetal growth restriction antepartum on their problem list.  Patient reports no complaints.  Contractions: Not present. Vag. Bleeding: None.  Movement: Present. Denies leaking of fluid.   The following portions of the patient's history were reviewed and updated as appropriate: allergies, current medications, past family history, past medical history, past social history, past surgical history and problem list.   Objective:   Vitals:   08/24/22 1106  BP: (!) 103/54  Pulse: 78  Weight: 127 lb (57.6 kg)    Fetal Status: Fetal Heart Rate (bpm): 145   Movement: Present     General:  Alert, oriented and cooperative. Patient is in no acute distress.  Skin: Skin is warm and dry. No rash noted.   Cardiovascular: Normal heart rate noted  Respiratory: Normal respiratory effort, no problems with respiration noted  Abdomen: Soft, gravid, appropriate for gestational age.  Pain/Pressure: Present Reva Bores Hicks ctxs)     Pelvic: Cervical exam deferred        Extremities: Normal range of motion.  Edema: None  Mental Status: Normal mood and affect. Normal behavior. Normal judgment and thought content.   Assessment and Plan:  Pregnancy: [id number] at [redacted]w[redacted]d 1. Supervision of other normal pregnancy, antepartum FHT and FH normal  2. HSV infection PPx at 35 weeks  3. Fetal growth restriction antepartum Dopplers normal. EFW 6th% BPP 8/8  Preterm labor symptoms and general obstetric precautions including but not limited to vaginal bleeding, contractions, leaking of fluid and fetal movement were reviewed in detail with the patient. Please refer to After Visit Summary for other counseling recommendations.   No  follow-ups on file.  Future Appointments  Date Time Provider [place]  08/31/2022  3:30 PM [facility] NURSE [facility]  08/31/2022  3:45 PM [facility] US1 [facility]  09/07/2022 11:15 AM Truett Mainland, DO [facility] None  09/07/2022  2:15 PM [facility] NURSE [facility]  09/07/2022  2:30 PM [facility] US3 [facility]  09/14/2022 11:15 AM [facility] NURSE [facility]  09/14/2022 11:30 AM [facility] US3 [facility]  09/21/2022 11:15 AM Truett Mainland, DO [facility] None  10/05/2022 11:15 AM Truett Mainland, DO [facility] None  10/11/2022  9:55 AM Truett Mainland, DO [facility] None  10/19/2022 11:15 AM Truett Mainland, DO [facility] None    Truett Mainland, DO

## 2022-08-31 ENCOUNTER — Ambulatory Visit: Payer: Medicaid Other | Admitting: *Deleted

## 2022-08-31 ENCOUNTER — Ambulatory Visit: Payer: Medicaid Other | Attending: Obstetrics

## 2022-08-31 VITALS — BP 110/54 | HR 78

## 2022-08-31 DIAGNOSIS — O365930 Maternal care for other known or suspected poor fetal growth, third trimester, not applicable or unspecified: Secondary | ICD-10-CM | POA: Diagnosis not present

## 2022-08-31 DIAGNOSIS — O35BXX0 Maternal care for other (suspected) fetal abnormality and damage, fetal cardiac anomalies, not applicable or unspecified: Secondary | ICD-10-CM

## 2022-08-31 DIAGNOSIS — O0933 Supervision of pregnancy with insufficient antenatal care, third trimester: Secondary | ICD-10-CM | POA: Diagnosis not present

## 2022-08-31 DIAGNOSIS — O99333 Smoking (tobacco) complicating pregnancy, third trimester: Secondary | ICD-10-CM | POA: Diagnosis not present

## 2022-08-31 DIAGNOSIS — Z7689 Persons encountering health services in other specified circumstances: Secondary | ICD-10-CM | POA: Diagnosis not present

## 2022-08-31 DIAGNOSIS — Z3A32 32 weeks gestation of pregnancy: Secondary | ICD-10-CM | POA: Diagnosis not present

## 2022-09-01 ENCOUNTER — Other Ambulatory Visit: Payer: Self-pay | Admitting: *Deleted

## 2022-09-01 DIAGNOSIS — O365930 Maternal care for other known or suspected poor fetal growth, third trimester, not applicable or unspecified: Secondary | ICD-10-CM

## 2022-09-07 ENCOUNTER — Encounter (HOSPITAL_COMMUNITY): Payer: Self-pay | Admitting: Obstetrics & Gynecology

## 2022-09-07 ENCOUNTER — Inpatient Hospital Stay (HOSPITAL_BASED_OUTPATIENT_CLINIC_OR_DEPARTMENT_OTHER): Payer: Medicaid Other

## 2022-09-07 ENCOUNTER — Ambulatory Visit (INDEPENDENT_AMBULATORY_CARE_PROVIDER_SITE_OTHER): Payer: Medicaid Other | Admitting: Family Medicine

## 2022-09-07 ENCOUNTER — Ambulatory Visit: Payer: Medicaid Other

## 2022-09-07 ENCOUNTER — Inpatient Hospital Stay (HOSPITAL_COMMUNITY)
Admission: AD | Admit: 2022-09-07 | Discharge: 2022-09-07 | Disposition: A | Discharge status: Home / Self Care | Payer: Medicaid Other | Attending: Obstetrics & Gynecology | Admitting: Obstetrics & Gynecology

## 2022-09-07 VITALS — BP 106/60 | HR 84 | Wt 129.0 lb

## 2022-09-07 DIAGNOSIS — Z3A33 33 weeks gestation of pregnancy: Secondary | ICD-10-CM | POA: Diagnosis not present

## 2022-09-07 DIAGNOSIS — O365931 Maternal care for other known or suspected poor fetal growth, third trimester, fetus 1: Secondary | ICD-10-CM | POA: Diagnosis not present

## 2022-09-07 DIAGNOSIS — O0933 Supervision of pregnancy with insufficient antenatal care, third trimester: Secondary | ICD-10-CM

## 2022-09-07 DIAGNOSIS — O365930 Maternal care for other known or suspected poor fetal growth, third trimester, not applicable or unspecified: Secondary | ICD-10-CM | POA: Insufficient documentation

## 2022-09-07 DIAGNOSIS — O365990 Maternal care for other known or suspected poor fetal growth, unspecified trimester, not applicable or unspecified: Secondary | ICD-10-CM

## 2022-09-07 DIAGNOSIS — Z3480 Encounter for supervision of other normal pregnancy, unspecified trimester: Secondary | ICD-10-CM

## 2022-09-07 DIAGNOSIS — B009 Herpesviral infection, unspecified: Secondary | ICD-10-CM

## 2022-09-07 DIAGNOSIS — O99333 Smoking (tobacco) complicating pregnancy, third trimester: Secondary | ICD-10-CM | POA: Diagnosis not present

## 2022-09-07 DIAGNOSIS — Z3689 Encounter for other specified antenatal screening: Secondary | ICD-10-CM

## 2022-09-07 NOTE — MAU Provider Note (Signed)
History     CSN: [id number]  Arrival date and time: 09/07/22 1357   Event Date/Time   First Provider Initiated Contact with Patient 09/07/22 1416      Chief Complaint  Patient presents with   Fetal Monitoring    Sent from the office   Vaginal Discharge   HPI  Amandalynn Guttierrez is a 27 y.o. [id number] at 78w5dwho presents for evaluation of fetal heart rate. Patient reports she was on the NST at the office and had one deceleration and no accelerations so she was sent for more fetal monitoring. She is without complaint. She denies any pain.  She denies any vaginal bleeding, discharge, and leaking of fluid. Denies any constipation, diarrhea or any urinary complaints. Reports normal fetal movement.   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    pt reports last outbreak years ago   Vaginal Pap smear, abnormal     Past Surgical History:  Procedure Laterality Date   NO PAST SURGERIES      Family History  Problem Relation Age of Onset   Stroke Mother    Hypertension Mother    Cancer Neg Hx    Diabetes Neg Hx    Asthma Neg Hx    Heart disease Neg Hx     Social History   Tobacco Use   Smoking status: Some Days    Packs/day: 0.25    Types: Cigarettes   Smokeless tobacco: Never  Vaping Use   Vaping Use: Some days  Substance Use Topics   Alcohol use: Not Currently   Drug use: Not Currently    Allergies:  Allergies  Allergen Reactions   Kiwi Extract Other (See Comments)    Numbness and tingling in mouth when eating kiwi    No medications prior to admission.    Review of Systems  Constitutional: Negative.  Negative for fatigue and fever.  HENT: Negative.    Respiratory: Negative.  Negative for shortness of breath.    Cardiovascular: Negative.  Negative for chest pain.  Gastrointestinal: Negative.  Negative for abdominal pain, constipation, diarrhea, nausea and vomiting.  Genitourinary: Negative.  Negative for dysuria, vaginal bleeding and vaginal discharge.  Neurological: Negative.  Negative for dizziness and headaches.   Physical Exam   Blood pressure (!) 101/57, pulse 88, temperature 98 F (36.7 C), temperature source Oral, resp. rate 18, height 5' 3"$  (1.6 m), weight 58.9 kg, last menstrual period 01/19/2022, SpO2 100 %, unknown if currently breastfeeding.  Patient Vitals for the past 24 hrs:  BP Temp Temp src Pulse Resp SpO2 Height Weight  09/07/22 1415 (!) 101/57 -- -- 88 -- 100 % -- --  09/07/22 1411 (!) 104/57 98 F (36.7 C) Oral 85 18 100 % 5' 3"$  (1.6 m) 58.9 kg    Physical Exam Vitals and nursing note reviewed.  Constitutional:      General: She is not in acute distress.    Appearance: She is well-developed.  HENT:     Head:  Normocephalic.  Eyes:     Pupils: Pupils are equal, round, and reactive to light.  Cardiovascular:     Rate and Rhythm: Normal rate and regular rhythm.     Heart sounds: Normal heart sounds.  Pulmonary:     Effort: Pulmonary effort is normal. No respiratory distress.     Breath sounds: Normal breath sounds.  Abdominal:     General: Bowel sounds are normal. There is no distension.     Palpations: Abdomen is soft.     Tenderness: There is no abdominal tenderness.  Skin:    General: Skin is warm and dry.  Neurological:     Mental Status: She is alert and oriented to person, place, and time.  Psychiatric:        Mood and Affect: Mood normal.        Behavior: Behavior normal.        Thought Content: Thought content normal.        Judgment: Judgment normal.     Fetal Tracing:  Baseline: 145 Variability: moderate Accels: 15x15 Decels: none  Toco: none  MAU Course  Procedures  [place] MFM OB Follow Up  Result Date:  09/07/2022 ----------------------------------------------------------------------  OBSTETRICS REPORT                       (Signed Final 09/07/2022 03:50 pm) ---------------------------------------------------------------------- Patient Info  ID #:       [id number]                          D.O.B.:  Feb 19, 1996 (26 yrs)  Name:       Antonietta Jewel                Visit Date: 09/07/2022 02:46 pm ---------------------------------------------------------------------- Performed By  Attending:        Tama High MD        Ref. Address:     Faculty  Performed By:     Berlinda Last          Secondary Phy.:   [facility] MAU/Triage                    RDMS  Referred By:      Angelita Ingles             [facility]                    [place] ---------------------------------------------------------------------- Orders  #  Description                           Code        Ordered By  1  [place] MFM FETAL BPP WO NON               76819.01    Giovani Neumeister     STRESS  2  [place] MFM UA CORD DOPPLER                76820.02    Cass  3  [place] MFM OB FOLLOW UP  [id number]    Faith Patricelli ----------------------------------------------------------------------  #  Order #                     Accession #                Episode #  1  [id number]                   [id number]                 [id number]  2  [id number]                   [id number]                 [id number]  3  [id number]                   [id number]                 [id number] ---------------------------------------------------------------------- Indications  Maternal care for known or suspected poor      O36.5930  fetal growth, third trimester, not applicable or  unspecified IUGR  [redacted] weeks gestation of pregnancy                Z3A.33  Tobacco use complicating pregnancy, third      O99.333  trimester  Insufficient Prenatal Care                     O09.30  ---------------------------------------------------------------------- Fetal Evaluation  Num Of Fetuses:         1  Fetal Heart Rate(bpm):  146  Cardiac Activity:       Observed  Presentation:           Cephalic  Placenta:               Posterior  P. Cord Insertion:      Previously visualized  Amniotic Fluid  AFI FV:      Within normal limits  AFI Sum(cm)     %Tile       Largest Pocket(cm)  12.9            40          4.3  RUQ(cm)       RLQ(cm)       LUQ(cm)        LLQ(cm)  4.3           3.3           2.2            3.1 ---------------------------------------------------------------------- Biophysical Evaluation  Amniotic F.V:   Within normal limits       F. Tone:        Observed  F. Movement:    Observed                   Score:          8/8  F. Breathing:   Observed ---------------------------------------------------------------------- Biometry  BPD:      80.4  mm     G. Age:  32w 2d         11  %    CI:        76.79   %    70 - 86  FL/HC:      21.0   %    19.4 - 21.8  HC:      290.6  mm     G. Age:  32w 0d        1.1  %    HC/AC:      1.03        0.96 - 1.11  AC:      282.2  mm     G. Age:  32w 2d         15  %    FL/BPD:     75.7   %    71 - 87  FL:       60.9  mm     G. Age:  31w 4d        3.8  %    FL/AC:      21.6   %    20 - 24  Est. FW:    1891  gm      4 lb 3 oz      7  % ---------------------------------------------------------------------- OB History  Gravidity:    6         Term:   1        Prem:   1        SAB:   2  TOP:          1       Ectopic:  0        Living: 2 ---------------------------------------------------------------------- Gestational Age  LMP:           33w 0d        Date:  01/19/22                   EDD:   10/26/22  U/S Today:     32w 0d                                        EDD:   11/02/22  Best:          33w 5d     Det. By:  U/S  (06/13/22)          EDD:   10/21/22 ----------------------------------------------------------------------  Anatomy  Cranium:               Appears normal         Aortic Arch:            Appears normal  Cavum:                 Appears normal         Ductal Arch:            Appears normal  Ventricles:            Appears normal         Diaphragm:              Appears normal  Choroid Plexus:        Previously seen        Stomach:                Appears normal, left  sided  Cerebellum:            Previously seen        Abdomen:                Appears normal  Posterior Fossa:       Previously seen        Abdominal Wall:         Previously seen  Nuchal Fold:           Previously seen        Cord Vessels:           Previously seen  Face:                  Orbits and profile     Kidneys:                Appear normal                         previously seen  Lips:                  Previously seen        Bladder:                Appears normal  Thoracic:              Appears normal         Spine:                  Previously seen  Heart:                 Appears normal         Upper Extremities:      Previously seen                         (4CH, axis, and                         situs)  RVOT:                  Appears normal         Lower Extremities:      Previously seen  LVOT:                  Appears normal  Other:  VC, 3VV and 3VTV visualized. ---------------------------------------------------------------------- Doppler - Fetal Vessels  Umbilical Artery   S/D     %tile      RI    %tile      PI    %tile     PSV    ADFV    RDFV                                                     (cm/s)    2.4       40     0.6       53     0.9       57     43.2      No      No ---------------------------------------------------------------------- Cervix Uterus Adnexa  Cervix  Not visualized (advanced GA >24wks)  Adnexa  No abnormality visualized ---------------------------------------------------------------------- Impression  Patient  was sent over to the MAU after her prenatal visit.   NST performed at your office was not reactive.  Patient also  complained of vaginal bleeding (spotting).  On today's ultrasound, the estimated fetal weight is at the 7th  percentile and the abdominal circumference measurement is  at the 15th percentile.  Interval weight gain is 534 g.  Amniotic  fluid is normal and good fetal activity seen.  Cephalic  presentation.  Umbilical artery Doppler showed normal  forward diastolic flow.  BPP 8/8.  Placenta looks normal with no evidence of retroplacental  hemorrhage. ---------------------------------------------------------------------- Recommendations  -BPP and UA Doppler next week. ----------------------------------------------------------------------                 Tama High, MD Electronically Signed Final Report   09/07/2022 03:50 pm ----------------------------------------------------------------------  [place] MFM UA Cord Doppler  Result Date: 09/07/2022 ----------------------------------------------------------------------  OBSTETRICS REPORT                       (Signed Final 09/07/2022 03:50 pm) ---------------------------------------------------------------------- Patient Info  ID #:       [id number]                          D.O.B.:  [birth year] (26 yrs)  Name:       Antonietta Jewel                Visit Date: 09/07/2022 02:46 pm ---------------------------------------------------------------------- Performed By  Attending:        Tama High MD        Ref. Address:     Faculty  Performed By:     Berlinda Last          Secondary Phy.:   [facility] MAU/Triage                    RDMS  Referred By:      Angelita Ingles             [facility]                    [place] ---------------------------------------------------------------------- Orders  #  Description                           Code        Ordered By  1  [place] MFM FETAL BPP WO NON               76819.01    Kiri Hinderliter     STRESS  2  [place] MFM UA CORD  DOPPLER                76820.02    [place]  3  [place] MFM OB FOLLOW UP                   [id number]    Zigmond Trela ----------------------------------------------------------------------  #  Order #                     Accession #                Episode #  1  [id number]                   [id number]                 [id number]  2  [id number]                   [id number]                 [id number]  3  [id number]                   [id number]                 [id number] ---------------------------------------------------------------------- Indications  Maternal care for known or suspected poor      O36.5930  fetal growth, third trimester, not applicable or  unspecified IUGR  [redacted] weeks gestation of pregnancy                Z3A.33  Tobacco use complicating pregnancy, third      O99.333  trimester  Insufficient Prenatal Care                     O09.30 ---------------------------------------------------------------------- Fetal Evaluation  Num Of Fetuses:         1  Fetal Heart Rate(bpm):  146  Cardiac Activity:       Observed  Presentation:           Cephalic  Placenta:               Posterior  P. Cord Insertion:      Previously visualized  Amniotic Fluid  AFI FV:      Within normal limits  AFI Sum(cm)     %Tile       Largest Pocket(cm)  12.9            40          4.3  RUQ(cm)       RLQ(cm)       LUQ(cm)        LLQ(cm)  4.3           3.3           2.2            3.1 ---------------------------------------------------------------------- Biophysical Evaluation  Amniotic F.V:   Within normal limits       F. Tone:        Observed  F. Movement:    Observed                   Score:          8/8  F. Breathing:   Observed ---------------------------------------------------------------------- Biometry  BPD:      80.4  mm     G. Age:  32w 2d         11  %    CI:        76.79   %    70 - 86                                                          FL/HC:      21.0   %    19.4 - 21.8  HC:      290.6  mm  G. Age:  32w 0d        1.1  %     HC/AC:      1.03        0.96 - 1.11  AC:      282.2  mm     G. Age:  32w 2d         15  %    FL/BPD:     75.7   %    71 - 87  FL:       60.9  mm     G. Age:  31w 4d        3.8  %    FL/AC:      21.6   %    20 - 24  Est. FW:    1891  gm      4 lb 3 oz      7  % ---------------------------------------------------------------------- OB History  Gravidity:    6         Term:   1        Prem:   1        SAB:   2  TOP:          1       Ectopic:  0        Living: 2 ---------------------------------------------------------------------- Gestational Age  LMP:           33w 0d        Date:  01/19/22                   EDD:   10/26/22  U/S Today:     32w 0d                                        EDD:   11/02/22  Best:          33w 5d     Det. By:  U/S  (06/13/22)          EDD:   10/21/22 ---------------------------------------------------------------------- Anatomy  Cranium:               Appears normal         Aortic Arch:            Appears normal  Cavum:                 Appears normal         Ductal Arch:            Appears normal  Ventricles:            Appears normal         Diaphragm:              Appears normal  Choroid Plexus:        Previously seen        Stomach:                Appears normal, left  sided  Cerebellum:            Previously seen        Abdomen:                Appears normal  Posterior Fossa:       Previously seen        Abdominal Wall:         Previously seen  Nuchal Fold:           Previously seen        Cord Vessels:           Previously seen  Face:                  Orbits and profile     Kidneys:                Appear normal                         previously seen  Lips:                  Previously seen        Bladder:                Appears normal  Thoracic:              Appears normal         Spine:                  Previously seen  Heart:                 Appears normal         Upper Extremities:      Previously seen                          (4CH, axis, and                         situs)  RVOT:                  Appears normal         Lower Extremities:      Previously seen  LVOT:                  Appears normal  Other:  VC, 3VV and 3VTV visualized. ---------------------------------------------------------------------- Doppler - Fetal Vessels  Umbilical Artery   S/D     %tile      RI    %tile      PI    %tile     PSV    ADFV    RDFV                                                     (cm/s)    2.4       40     0.6       53     0.9       57     43.2      No      No ---------------------------------------------------------------------- Cervix Uterus Adnexa  Cervix  Not visualized (advanced GA >24wks)  Adnexa  No abnormality visualized ---------------------------------------------------------------------- Impression  Patient was sent over to the MAU after her prenatal visit.  NST performed at your office was not reactive.  Patient also  complained of vaginal bleeding (spotting).  On today's ultrasound, the estimated fetal weight is at the 7th  percentile and the abdominal circumference measurement is  at the 15th percentile.  Interval weight gain is 534 g.  Amniotic  fluid is normal and good fetal activity seen.  Cephalic  presentation.  Umbilical artery Doppler showed normal  forward diastolic flow.  BPP 8/8.  Placenta looks normal with no evidence of retroplacental  hemorrhage. ---------------------------------------------------------------------- Recommendations  -BPP and UA Doppler next week. ----------------------------------------------------------------------                 Tama High, MD Electronically Signed Final Report   09/07/2022 03:50 pm ----------------------------------------------------------------------  [place] MFM Fetal BPP Wo Non Stress  Result Date: 09/07/2022 ----------------------------------------------------------------------  OBSTETRICS REPORT                       (Signed Final 09/07/2022 03:50 pm)  ---------------------------------------------------------------------- Patient Info  ID #:       [id number]                          D.O.B.:  13-Apr-1996 (26 yrs)  Name:       Antonietta Jewel                Visit Date: 09/07/2022 02:46 pm ---------------------------------------------------------------------- Performed By  Attending:        Tama High MD        Ref. Address:     Faculty  Performed By:     Berlinda Last          Secondary Phy.:   [facility] MAU/Triage                    RDMS  Referred By:      Angelita Ingles             Location[facility]                    [place] ---------------------------------------------------------------------- Orders  #  Description                           Code        Ordered By  1  [place] MFM FETAL BPP WO NON               76819.01    Paulanthony Gleaves     STRESS  2  [place] MFM UA CORD DOPPLER                76820.02    [place]erdinand  3  [place] MFM OB FOLLOW UP                   [id number]    Margarete Horace ----------------------------------------------------------------------  #  Order #                     Accession #                Episode #  1  [id number]                   [id number]                 [id number]  2  [id number]                   [id number]                 [id number]  3  [id number]                   [id number]                 [id number] ---------------------------------------------------------------------- Indications  Maternal care for known or suspected poor      O36.5930  fetal growth, third trimester, not applicable or  unspecified IUGR  [redacted] weeks gestation of pregnancy                Z3A.33  Tobacco use complicating pregnancy, third      O99.333  trimester  Insufficient Prenatal Care                     O09.30 ---------------------------------------------------------------------- Fetal Evaluation  Num Of Fetuses:         1  Fetal Heart Rate(bpm):  146  Cardiac Activity:       Observed  Presentation:           Cephalic   Placenta:               Posterior  P. Cord Insertion:      Previously visualized  Amniotic Fluid  AFI FV:      Within normal limits  AFI Sum(cm)     %Tile       Largest Pocket(cm)  12.9            40          4.3  RUQ(cm)       RLQ(cm)       LUQ(cm)        LLQ(cm)  4.3           3.3           2.2            3.1 ---------------------------------------------------------------------- Biophysical Evaluation  Amniotic F.V:   Within normal limits       F. Tone:        Observed  F. Movement:    Observed                   Score:          8/8  F. Breathing:   Observed ---------------------------------------------------------------------- Biometry  BPD:      80.4  mm     G. Age:  32w 2d         11  %    CI:        76.79   %    70 - 86                                                          FL/HC:      21.0   %    19.4 - 21.8  HC:      290.6  mm  G. Age:  32w 0d        1.1  %    HC/AC:      1.03        0.96 - 1.11  AC:      282.2  mm     G. Age:  32w 2d         15  %    FL/BPD:     75.7   %    71 - 87  FL:       60.9  mm     G. Age:  31w 4d        3.8  %    FL/AC:      21.6   %    20 - 24  Est. FW:    1891  gm      4 lb 3 oz      7  % ---------------------------------------------------------------------- OB History  Gravidity:    6         Term:   1        Prem:   1        SAB:   2  TOP:          1       Ectopic:  0        Living: 2 ---------------------------------------------------------------------- Gestational Age  LMP:           33w 0d        Date:  01/19/22                   EDD:   10/26/22  U/S Today:     32w 0d                                        EDD:   11/02/22  Best:          33w 5d     Det. By:  U/S  (06/13/22)          EDD:   10/21/22 ---------------------------------------------------------------------- Anatomy  Cranium:               Appears normal         Aortic Arch:            Appears normal  Cavum:                 Appears normal         Ductal Arch:            Appears normal  Ventricles:            Appears  normal         Diaphragm:              Appears normal  Choroid Plexus:        Previously seen        Stomach:                Appears normal, left  sided  Cerebellum:            Previously seen        Abdomen:                Appears normal  Posterior Fossa:       Previously seen        Abdominal Wall:         Previously seen  Nuchal Fold:           Previously seen        Cord Vessels:           Previously seen  Face:                  Orbits and profile     Kidneys:                Appear normal                         previously seen  Lips:                  Previously seen        Bladder:                Appears normal  Thoracic:              Appears normal         Spine:                  Previously seen  Heart:                 Appears normal         Upper Extremities:      Previously seen                         (4CH, axis, and                         situs)  RVOT:                  Appears normal         Lower Extremities:      Previously seen  LVOT:                  Appears normal  Other:  VC, 3VV and 3VTV visualized. ---------------------------------------------------------------------- Doppler - Fetal Vessels  Umbilical Artery   S/D     %tile      RI    %tile      PI    %tile     PSV    ADFV    RDFV                                                     (cm/s)    2.4       40     0.6       53     0.9       57     43.2      No      No ---------------------------------------------------------------------- Cervix Uterus Adnexa  Cervix  Not visualized (advanced GA >24wks)  Adnexa  No abnormality visualized ---------------------------------------------------------------------- Impression  Patient  was sent over to the MAU after her prenatal visit.  NST performed at your office was not reactive.  Patient also  complained of vaginal bleeding (spotting).  On today's ultrasound, the estimated fetal weight is at the 7th  percentile and the abdominal  circumference measurement is  at the 15th percentile.  Interval weight gain is 534 g.  Amniotic  fluid is normal and good fetal activity seen.  Cephalic  presentation.  Umbilical artery Doppler showed normal  forward diastolic flow.  BPP 8/8.  Placenta looks normal with no evidence of retroplacental  hemorrhage. ---------------------------------------------------------------------- Recommendations  -BPP and UA Doppler next week. ----------------------------------------------------------------------                 Tama High, MD Electronically Signed Final Report   09/07/2022 03:50 pm ----------------------------------------------------------------------    MDM Labs ordered and reviewed.   NST reactive Per MFM, patient will miss afternoon appointment and recommends antenatal testing be done in MAU  [place] MFM BPP [place] MFM OB Follow UP US MFM Cord Dopplers  Assessment and Plan   1. NST (non-stress test) reactive on fetal surveillance   2. [redacted] weeks gestation of pregnancy     -Discharge home in stable condition -Third trimester precautions discussed -Patient advised to follow-up with OB as scheduled for prenatal care -Patient may return to MAU as needed or if her condition were to change or worsen  Wende Mott, CNM 09/07/2022, 2:16 PM

## 2022-09-07 NOTE — MAU Note (Signed)
.  Elizabeth [name] is a 27 y.o. at 76w5dhere in MAU reporting[facility] due to her NST in the office. She reports she had intercourse last night and has since been having pelvic pressure (7/10), spotting, and increase in mucous-like discharge. Reports good FM.  LMP: N/A Onset of complaint: Yesterday Pain score: 7/10 Vitals:   09/07/22 1411  BP: (!) 104/57  Pulse: 85  Resp: 18  Temp: 98 F (36.7 C)  SpO2: 100%     FHT:155 Lab orders placed from triage:  N/A

## 2022-09-07 NOTE — Progress Notes (Signed)
   PRENATAL VISIT NOTE  Subjective:  Elizabeth [name] is a 27 y.o. [id number] at 19w5dbeing seen today for ongoing prenatal care.  She is currently monitored for the following issues for this high-risk pregnancy and has HSV infection; Supervision of other normal pregnancy, antepartum; and Fetal growth restriction antepartum on their problem list.  Patient reports  had episode of bleeding after sex last night. She stopped bleeding, but has cramping .  Contractions: Irritability. Vag. Bleeding: None.  Movement: Present. Denies leaking of fluid.   The following portions of the patient's history were reviewed and updated as appropriate: allergies, current medications, past family history, past medical history, past social history, past surgical history and problem list.   Objective:   Vitals:   09/07/22 1110  BP: 106/60  Pulse: 84  Weight: 129 lb (58.5 kg)    Fetal Status: Fetal Heart Rate (bpm): 161 Fundal Height: 32 cm Movement: Present     General:  Alert, oriented and cooperative. Patient is in no acute distress.  Skin: Skin is warm and dry. No rash noted.   Cardiovascular: Normal heart rate noted  Respiratory: Normal respiratory effort, no problems with respiration noted  Abdomen: Soft, gravid, appropriate for gestational age.  Pain/Pressure: Present     Pelvic: Cervical exam performed in the presence of a chaperone Dilation: 1 Effacement (%): Thick Station: -3  Extremities: Normal range of motion.  Edema: None  Mental Status: Normal mood and affect. Normal behavior. Normal judgment and thought content.   Assessment and Plan:  Pregnancy: [id number] 329w5d. [redacted] weeks gestation of pregnancy  2. Supervision of other normal pregnancy, antepartum FHT and FH normal No bleeding on exam.  NST done - moderate variability. 1 variable deceleration. No accels. Patient sent to MAU.  3. Fetal growth restriction antepartum Spoke with MFM - will get growth US[place]oreaBPP, and dopplers at MAU  4.  HSV infection  Preterm labor symptoms and general obstetric precautions including but not limited to vaginal bleeding, contractions, leaking of fluid and fetal movement were reviewed in detail with the patient. Please refer to After Visit Summary for other counseling recommendations.   No follow-ups on file.  Future Appointments  Date Time Provider [facility] [place]/29/2024 11:15 AM [facility] NURSE [facility]2/29/2024 11:30 AM [facility] US3 [facility]3/01/2023 11:15 AM StTruett MainlandDO CWH-WMHP None  09/21/2022  1:00 PM [facility] NURSE [facility]3/01/2023  1:15 PM [facility] NST [facility]/14/2024 10:15 AM [facility] NURSE [facility]3/14/2024 10:30 AM [facility]MC[facility]MFC US2 [facility]3/21/2024 11:15 AM StTruett MainlandDO CWH-WMHP None  10/05/2022  2:30 PM WMC-MFC NURSE [facility]3/21/2024  2:45 PM WMC-MFC US5 [facility]3/27/2024  9:55 AM StTruett MainlandDO CWH-WMHP None  10/19/2022 11:15 AM StTruett MainlandDO CWH-WMHP None    JaTruett MainlandDO

## 2022-09-07 NOTE — Discharge Instructions (Signed)

## 2022-09-14 ENCOUNTER — Ambulatory Visit: Payer: Medicaid Other | Admitting: *Deleted

## 2022-09-14 ENCOUNTER — Ambulatory Visit: Payer: Medicaid Other | Attending: Obstetrics

## 2022-09-14 VITALS — BP 113/61 | HR 85

## 2022-09-14 DIAGNOSIS — O99333 Smoking (tobacco) complicating pregnancy, third trimester: Secondary | ICD-10-CM | POA: Diagnosis not present

## 2022-09-14 DIAGNOSIS — Z3A34 34 weeks gestation of pregnancy: Secondary | ICD-10-CM

## 2022-09-14 DIAGNOSIS — O365930 Maternal care for other known or suspected poor fetal growth, third trimester, not applicable or unspecified: Secondary | ICD-10-CM

## 2022-09-14 DIAGNOSIS — O0933 Supervision of pregnancy with insufficient antenatal care, third trimester: Secondary | ICD-10-CM | POA: Diagnosis not present

## 2022-09-15 DIAGNOSIS — Z419 Encounter for procedure for purposes other than remedying health state, unspecified: Secondary | ICD-10-CM | POA: Diagnosis not present

## 2022-09-16 ENCOUNTER — Other Ambulatory Visit: Payer: Self-pay

## 2022-09-16 ENCOUNTER — Encounter (HOSPITAL_BASED_OUTPATIENT_CLINIC_OR_DEPARTMENT_OTHER): Payer: Self-pay

## 2022-09-16 ENCOUNTER — Emergency Department (HOSPITAL_BASED_OUTPATIENT_CLINIC_OR_DEPARTMENT_OTHER)
Admission: EM | Admit: 2022-09-16 | Discharge: 2022-09-16 | Discharge status: Against Medical Advice | Payer: Medicaid Other | Attending: Emergency Medicine | Admitting: Emergency Medicine

## 2022-09-16 DIAGNOSIS — Z3A35 35 weeks gestation of pregnancy: Secondary | ICD-10-CM | POA: Diagnosis not present

## 2022-09-16 DIAGNOSIS — N898 Other specified noninflammatory disorders of vagina: Secondary | ICD-10-CM | POA: Insufficient documentation

## 2022-09-16 DIAGNOSIS — O26893 Other specified pregnancy related conditions, third trimester: Secondary | ICD-10-CM | POA: Diagnosis not present

## 2022-09-16 DIAGNOSIS — O23593 Infection of other part of genital tract in pregnancy, third trimester: Secondary | ICD-10-CM | POA: Diagnosis not present

## 2022-09-16 DIAGNOSIS — R350 Frequency of micturition: Secondary | ICD-10-CM | POA: Diagnosis not present

## 2022-09-16 LAB — URINALYSIS, ROUTINE W REFLEX MICROSCOPIC
Bilirubin Urine: NEGATIVE
Glucose, UA: NEGATIVE mg/dL
Hgb urine dipstick: NEGATIVE
Ketones, ur: NEGATIVE mg/dL
Nitrite: NEGATIVE
Protein, ur: NEGATIVE mg/dL
Specific Gravity, Urine: 1.01 (ref 1.005–1.030)
pH: 7 (ref 5.0–8.0)

## 2022-09-16 LAB — URINALYSIS, MICROSCOPIC (REFLEX)

## 2022-09-16 NOTE — ED Triage Notes (Signed)
Pt reports vaginal itching for 2 days. Cannot wait to see her doctor due to the intensity. Pt is [redacted] weeks pregnant. Pt became sexually active again with the baby of the father. Pt reports she is worried about STD'S. Reports redness and irritation to vagina. Dysuria and increased frequency with urination.

## 2022-09-18 ENCOUNTER — Other Ambulatory Visit (HOSPITAL_COMMUNITY)
Admission: RE | Admit: 2022-09-18 | Discharge: 2022-09-18 | Disposition: A | Discharge status: Home / Self Care | Payer: Medicaid Other | Source: Ambulatory Visit | Attending: Obstetrics and Gynecology | Admitting: Obstetrics and Gynecology

## 2022-09-18 ENCOUNTER — Ambulatory Visit: Payer: Medicaid Other

## 2022-09-18 VITALS — BP 106/57 | HR 78 | Ht 63.0 in

## 2022-09-18 DIAGNOSIS — N898 Other specified noninflammatory disorders of vagina: Secondary | ICD-10-CM | POA: Diagnosis not present

## 2022-09-18 NOTE — Progress Notes (Signed)
SUBJECTIVE:  27 y.o. female complains of white vaginal discharge for 4 day(s). Denies abnormal vaginal bleeding or significant pelvic pain or fever. No UTI symptoms. Denies history of known exposure to STD. Patient is [redacted] weeks pregnant.  OBJECTIVE:  She appears well, afebrile.   ASSESSMENT:  Vaginal Discharge  Vaginal Odor   PLAN:  GC, chlamydia, trichomonas, BVAG, CVAG probe sent to lab. Treatment: To be determined once lab results are received ROV prn if symptoms persist or worsen.  Kathrene Alu RN

## 2022-09-19 LAB — CERVICOVAGINAL ANCILLARY ONLY
Bacterial Vaginitis (gardnerella): POSITIVE — AB
Candida Glabrata: NEGATIVE
Candida Vaginitis: POSITIVE — AB
Chlamydia: NEGATIVE
Comment: NEGATIVE
Comment: NEGATIVE
Comment: NEGATIVE
Comment: NEGATIVE
Comment: NEGATIVE
Comment: NORMAL
Neisseria Gonorrhea: NEGATIVE
Trichomonas: NEGATIVE

## 2022-09-20 ENCOUNTER — Other Ambulatory Visit: Payer: Self-pay | Admitting: Obstetrics and Gynecology

## 2022-09-21 ENCOUNTER — Ambulatory Visit: Payer: Medicaid Other

## 2022-09-21 ENCOUNTER — Other Ambulatory Visit (HOSPITAL_BASED_OUTPATIENT_CLINIC_OR_DEPARTMENT_OTHER): Payer: Self-pay

## 2022-09-21 ENCOUNTER — Ambulatory Visit (INDEPENDENT_AMBULATORY_CARE_PROVIDER_SITE_OTHER): Payer: Medicaid Other | Admitting: Family Medicine

## 2022-09-21 ENCOUNTER — Telehealth: Payer: Self-pay | Admitting: Lactation Services

## 2022-09-21 VITALS — BP 102/59 | HR 77 | Wt 128.0 lb

## 2022-09-21 DIAGNOSIS — O365990 Maternal care for other known or suspected poor fetal growth, unspecified trimester, not applicable or unspecified: Secondary | ICD-10-CM

## 2022-09-21 DIAGNOSIS — B009 Herpesviral infection, unspecified: Secondary | ICD-10-CM

## 2022-09-21 DIAGNOSIS — Z7689 Persons encountering health services in other specified circumstances: Secondary | ICD-10-CM | POA: Diagnosis not present

## 2022-09-21 DIAGNOSIS — Z3A35 35 weeks gestation of pregnancy: Secondary | ICD-10-CM

## 2022-09-21 DIAGNOSIS — O365930 Maternal care for other known or suspected poor fetal growth, third trimester, not applicable or unspecified: Secondary | ICD-10-CM

## 2022-09-21 DIAGNOSIS — Z3480 Encounter for supervision of other normal pregnancy, unspecified trimester: Secondary | ICD-10-CM

## 2022-09-21 MED ORDER — VALACYCLOVIR HCL 500 MG PO TABS
500.0000 mg | ORAL_TABLET | Freq: Two times a day (BID) | ORAL | 6 refills | Status: DC
  Filled 2022-09-21: qty 60, 30d supply, fill #0

## 2022-09-21 NOTE — Telephone Encounter (Signed)
-----   Message from Darliss Cheney, MD sent at 09/20/2022  4:29 PM EST ----- Treat per protocol

## 2022-09-21 NOTE — Telephone Encounter (Signed)
Called patient to give results and recommendation for treatment. Patient is a [employer] Patient, will route chart to them.

## 2022-09-21 NOTE — Progress Notes (Signed)
   PRENATAL VISIT NOTE  Subjective:  Elizabeth [name] is a 27 y.o. [id number] at 1w5dbeing seen today for ongoing prenatal care.  She is currently monitored for the following issues for this high-risk pregnancy and has HSV infection; Supervision of other normal pregnancy, antepartum; and Fetal growth restriction antepartum on their problem list.  Patient reports occasional contractions.  Contractions: Not present. Vag. Bleeding: Scant.  Movement: Present. Denies leaking of fluid.   The following portions of the patient's history were reviewed and updated as appropriate: allergies, current medications, past family history, past medical history, past social history, past surgical history and problem list.   Objective:   Vitals:   09/21/22 1040  BP: (!) 102/59  Pulse: 77  Weight: 128 lb (58.1 kg)    Fetal Status: Fetal Heart Rate (bpm): 145   Movement: Present     General:  Alert, oriented and cooperative. Patient is in no acute distress.  Skin: Skin is warm and dry. No rash noted.   Cardiovascular: Normal heart rate noted  Respiratory: Normal respiratory effort, no problems with respiration noted  Abdomen: Soft, gravid, appropriate for gestational age.  Pain/Pressure: Present     Pelvic: Cervical exam deferred        Extremities: Normal range of motion.  Edema: None  Mental Status: Normal mood and affect. Normal behavior. Normal judgment and thought content.   Assessment and Plan:  Pregnancy: [id number] 368w5d. Supervision of other normal pregnancy, antepartum FHT and FH normal  2. Fetal growth restriction antepartum Has USKoreaomorrow. Anticipate delivery at 39 weeks if stay on same trajectory  3. HSV infection Valtrex prescribed for PPx.  Preterm labor symptoms and general obstetric precautions including but not limited to vaginal bleeding, contractions, leaking of fluid and fetal movement were reviewed in detail with the patient. Please refer to After Visit Summary for other  counseling recommendations.   No follow-ups on file.  Future Appointments  Date Time Provider [place]3/02/2023 10:30 AM [facility]SE [facility]3/02/2023 10:45 AM WMC[facility]MFC NST [facility]3/14/2024 10:15 AM WMC-MFC NURSE [facility]3/14/2024 10:30 AM WMC-MFC US2 [facility]MFCUS [facility]3/14/2024  1:10 PM StTruett MainlandDO CWH-WMHP None  10/05/2022 11:15 AM StTruett MainlandDO CWH-WMHP None  10/05/2022  2:30 PM WMC-MFC NURSE [facility]3/21/2024  2:45 PM WMC-MFC US5 [facility]MFCUS [facility]3/27/2024  9:55 AM StTruett MainlandDO CWH-WMHP None  10/19/2022 11:15 AM StTruett MainlandDO CWH-WMHP None    JaTruett MainlandDO

## 2022-09-22 ENCOUNTER — Ambulatory Visit: Payer: Medicaid Other | Attending: Obstetrics | Admitting: *Deleted

## 2022-09-22 ENCOUNTER — Ambulatory Visit (HOSPITAL_BASED_OUTPATIENT_CLINIC_OR_DEPARTMENT_OTHER): Payer: Medicaid Other

## 2022-09-22 ENCOUNTER — Other Ambulatory Visit: Payer: Self-pay | Admitting: Obstetrics and Gynecology

## 2022-09-22 VITALS — BP 110/61 | HR 76

## 2022-09-22 DIAGNOSIS — Z3A35 35 weeks gestation of pregnancy: Secondary | ICD-10-CM | POA: Diagnosis not present

## 2022-09-22 DIAGNOSIS — B9689 Other specified bacterial agents as the cause of diseases classified elsewhere: Secondary | ICD-10-CM

## 2022-09-22 DIAGNOSIS — O365930 Maternal care for other known or suspected poor fetal growth, third trimester, not applicable or unspecified: Secondary | ICD-10-CM

## 2022-09-22 DIAGNOSIS — O365990 Maternal care for other known or suspected poor fetal growth, unspecified trimester, not applicable or unspecified: Secondary | ICD-10-CM | POA: Diagnosis not present

## 2022-09-22 NOTE — Procedures (Signed)
Elizabeth [name] June 15, 1996 [redacted]w[redacted]d Fetus A Non-Stress Test Interpretation for 09/22/22  Indication: IUGR  Fetal Heart Rate A Mode: External Baseline Rate (A): 135 bpm Variability: Moderate Accelerations: 15 x 15 Decelerations: None Multiple birth?: No  Uterine Activity Mode: Palpation, Toco Contraction Frequency (min): 2 UC Contraction Duration (sec): 80 Contraction Quality: Mild Resting Tone Palpated: Relaxed Resting Time: Adequate  Interpretation (Fetal Testing) Nonstress Test Interpretation: Reactive Overall Impression: Reassuring for gestational age Comments: Tracing reviewed by Dr FAnnamaria Boots

## 2022-09-25 ENCOUNTER — Other Ambulatory Visit: Payer: Self-pay

## 2022-09-25 ENCOUNTER — Other Ambulatory Visit (HOSPITAL_BASED_OUTPATIENT_CLINIC_OR_DEPARTMENT_OTHER): Payer: Self-pay

## 2022-09-25 MED ORDER — FLUCONAZOLE 150 MG PO TABS
150.0000 mg | ORAL_TABLET | Freq: Once | ORAL | 0 refills | Status: AC
  Filled 2022-09-25: qty 1, 1d supply, fill #0

## 2022-09-28 ENCOUNTER — Ambulatory Visit: Payer: Medicaid Other | Attending: Obstetrics

## 2022-09-28 ENCOUNTER — Encounter: Payer: Medicaid Other | Admitting: Family Medicine

## 2022-09-28 ENCOUNTER — Other Ambulatory Visit: Payer: Self-pay | Admitting: Obstetrics

## 2022-09-28 ENCOUNTER — Ambulatory Visit: Payer: Medicaid Other | Admitting: *Deleted

## 2022-09-28 VITALS — BP 110/51 | HR 68

## 2022-09-28 DIAGNOSIS — O365930 Maternal care for other known or suspected poor fetal growth, third trimester, not applicable or unspecified: Secondary | ICD-10-CM

## 2022-09-28 DIAGNOSIS — O0933 Supervision of pregnancy with insufficient antenatal care, third trimester: Secondary | ICD-10-CM

## 2022-09-28 DIAGNOSIS — O99333 Smoking (tobacco) complicating pregnancy, third trimester: Secondary | ICD-10-CM

## 2022-09-28 DIAGNOSIS — Z3A36 36 weeks gestation of pregnancy: Secondary | ICD-10-CM

## 2022-09-28 DIAGNOSIS — Z7689 Persons encountering health services in other specified circumstances: Secondary | ICD-10-CM | POA: Diagnosis not present

## 2022-10-05 ENCOUNTER — Ambulatory Visit: Payer: Medicaid Other

## 2022-10-05 ENCOUNTER — Ambulatory Visit (INDEPENDENT_AMBULATORY_CARE_PROVIDER_SITE_OTHER): Payer: Medicaid Other | Admitting: Family Medicine

## 2022-10-05 ENCOUNTER — Encounter: Payer: Self-pay | Admitting: General Practice

## 2022-10-05 VITALS — BP 104/55 | HR 72 | Wt 130.0 lb

## 2022-10-05 DIAGNOSIS — Z3480 Encounter for supervision of other normal pregnancy, unspecified trimester: Secondary | ICD-10-CM

## 2022-10-05 DIAGNOSIS — Z1339 Encounter for screening examination for other mental health and behavioral disorders: Secondary | ICD-10-CM

## 2022-10-05 DIAGNOSIS — Z3493 Encounter for supervision of normal pregnancy, unspecified, third trimester: Secondary | ICD-10-CM | POA: Diagnosis not present

## 2022-10-05 DIAGNOSIS — O365931 Maternal care for other known or suspected poor fetal growth, third trimester, fetus 1: Secondary | ICD-10-CM | POA: Diagnosis not present

## 2022-10-05 DIAGNOSIS — Z3A37 37 weeks gestation of pregnancy: Secondary | ICD-10-CM

## 2022-10-05 DIAGNOSIS — O365990 Maternal care for other known or suspected poor fetal growth, unspecified trimester, not applicable or unspecified: Secondary | ICD-10-CM

## 2022-10-05 NOTE — Progress Notes (Signed)
   PRENATAL VISIT NOTE  Subjective:  Elizabeth [name] is a 27 y.o. [id number] at [redacted]w[redacted]d being seen today for ongoing prenatal care.  She is currently monitored for the following issues for this high-risk pregnancy and has HSV infection; Supervision of other normal pregnancy, antepartum; and Fetal growth restriction antepartum on their problem list.  Patient reports no complaints.  Contractions: Irritability. Vag. Bleeding: Scant.  Movement: Present. Denies leaking of fluid.   The following portions of the patient's history were reviewed and updated as appropriate: allergies, current medications, past family history, past medical history, past social history, past surgical history and problem list.   Objective:   Vitals:   10/05/22 1109  BP: (!) 104/55  Pulse: 72  Weight: 130 lb (59 kg)    Fetal Status: Fetal Heart Rate (bpm): 143   Movement: Present     General:  Alert, oriented and cooperative. Patient is in no acute distress.  Skin: Skin is warm and dry. No rash noted.   Cardiovascular: Normal heart rate noted  Respiratory: Normal respiratory effort, no problems with respiration noted  Abdomen: Soft, gravid, appropriate for gestational age.  Pain/Pressure: Present     Pelvic: Cervical exam deferred        Extremities: Normal range of motion.  Edema: None  Mental Status: Normal mood and affect. Normal behavior. Normal judgment and thought content.   Assessment and Plan:  Pregnancy: [id number] at [redacted]w[redacted]d 1. [redacted] weeks gestation of pregnancy - Culture, beta strep (group b only)  2. Supervision of other normal pregnancy, antepartum FHT and FH normal  3. Fetal growth restriction antepartum MFM recommended delivery at 38 weeks. Will schedule. NST today reactive.   Term labor symptoms and general obstetric precautions including but not limited to vaginal bleeding, contractions, leaking of fluid and fetal movement were reviewed in detail with the patient. Please refer to After Visit  Summary for other counseling recommendations.   No follow-ups on file.  Future Appointments  Date Time Provider [place]  10/09/2022  6:30 AM MC-LD [place] None  11/16/2022  1:50 PM Constant, Vickii Chafe, MD CWH-WMHP None    Truett Mainland, DO

## 2022-10-06 ENCOUNTER — Telehealth (HOSPITAL_COMMUNITY): Payer: Self-pay | Admitting: *Deleted

## 2022-10-06 ENCOUNTER — Encounter (HOSPITAL_COMMUNITY): Payer: Self-pay | Admitting: *Deleted

## 2022-10-06 NOTE — Telephone Encounter (Signed)
Preadmission screen  

## 2022-10-09 ENCOUNTER — Inpatient Hospital Stay (HOSPITAL_COMMUNITY): Payer: Medicaid Other | Admitting: Anesthesiology

## 2022-10-09 ENCOUNTER — Inpatient Hospital Stay (HOSPITAL_COMMUNITY)
Admission: RE | Admit: 2022-10-09 | Discharge: 2022-10-11 | DRG: 806 | Disposition: A | Discharge status: Home / Self Care | Payer: Medicaid Other | Attending: Family Medicine | Admitting: Family Medicine

## 2022-10-09 ENCOUNTER — Other Ambulatory Visit: Payer: Self-pay

## 2022-10-09 ENCOUNTER — Encounter (HOSPITAL_COMMUNITY): Payer: Self-pay | Admitting: Family Medicine

## 2022-10-09 ENCOUNTER — Inpatient Hospital Stay (HOSPITAL_COMMUNITY): Payer: Medicaid Other

## 2022-10-09 DIAGNOSIS — O9832 Other infections with a predominantly sexual mode of transmission complicating childbirth: Secondary | ICD-10-CM | POA: Diagnosis not present

## 2022-10-09 DIAGNOSIS — B009 Herpesviral infection, unspecified: Secondary | ICD-10-CM | POA: Diagnosis present

## 2022-10-09 DIAGNOSIS — Z3A38 38 weeks gestation of pregnancy: Secondary | ICD-10-CM

## 2022-10-09 DIAGNOSIS — F17210 Nicotine dependence, cigarettes, uncomplicated: Secondary | ICD-10-CM | POA: Diagnosis present

## 2022-10-09 DIAGNOSIS — A6000 Herpesviral infection of urogenital system, unspecified: Secondary | ICD-10-CM | POA: Diagnosis not present

## 2022-10-09 DIAGNOSIS — O365930 Maternal care for other known or suspected poor fetal growth, third trimester, not applicable or unspecified: Secondary | ICD-10-CM | POA: Diagnosis not present

## 2022-10-09 DIAGNOSIS — O99334 Smoking (tobacco) complicating childbirth: Secondary | ICD-10-CM | POA: Diagnosis present

## 2022-10-09 DIAGNOSIS — Z3480 Encounter for supervision of other normal pregnancy, unspecified trimester: Secondary | ICD-10-CM

## 2022-10-09 DIAGNOSIS — O365990 Maternal care for other known or suspected poor fetal growth, unspecified trimester, not applicable or unspecified: Secondary | ICD-10-CM | POA: Diagnosis present

## 2022-10-09 DIAGNOSIS — Z3490 Encounter for supervision of normal pregnancy, unspecified, unspecified trimester: Secondary | ICD-10-CM | POA: Diagnosis present

## 2022-10-09 LAB — CBC
HCT: 33.5 % — ABNORMAL LOW (ref 36.0–46.0)
Hemoglobin: 11.5 g/dL — ABNORMAL LOW (ref 12.0–15.0)
MCH: 29.9 pg (ref 26.0–34.0)
MCHC: 34.3 g/dL (ref 30.0–36.0)
MCV: 87 fL (ref 80.0–100.0)
Platelets: 166 10*3/uL (ref 150–400)
RBC: 3.85 MIL/uL — ABNORMAL LOW (ref 3.87–5.11)
RDW: 13.7 % (ref 11.5–15.5)
WBC: 9.6 10*3/uL (ref 4.0–10.5)
nRBC: 0 % (ref 0.0–0.2)

## 2022-10-09 LAB — TYPE AND SCREEN
ABO/RH(D): A POS
Antibody Screen: NEGATIVE

## 2022-10-09 LAB — RPR: RPR Ser Ql: NONREACTIVE

## 2022-10-09 LAB — CULTURE, BETA STREP (GROUP B ONLY): Strep Gp B Culture: NEGATIVE

## 2022-10-09 MED ORDER — TERBUTALINE SULFATE 1 MG/ML IJ SOLN: 0.2500 mg | Freq: Once | INTRAMUSCULAR | Status: DC | PRN

## 2022-10-09 MED ORDER — OXYCODONE-ACETAMINOPHEN 5-325 MG PO TABS: 2.0000 | ORAL_TABLET | ORAL | Status: DC | PRN

## 2022-10-09 MED ORDER — ACETAMINOPHEN 325 MG PO TABS: 650.0000 mg | ORAL_TABLET | ORAL | Status: DC | PRN

## 2022-10-09 MED ORDER — OXYCODONE-ACETAMINOPHEN 5-325 MG PO TABS: 1.0000 | ORAL_TABLET | ORAL | Status: DC | PRN

## 2022-10-09 MED ORDER — BENZOCAINE-MENTHOL 20-0.5 % EX AERO
1.0000 | INHALATION_SPRAY | CUTANEOUS | Status: DC | PRN
  Administered 2022-10-09: 1 via TOPICAL
  Filled 2022-10-09: qty 56

## 2022-10-09 MED ORDER — OXYTOCIN-SODIUM CHLORIDE 30-0.9 UT/500ML-% IV SOLN
1.0000 m[IU]/min | INTRAVENOUS | Status: DC
  Administered 2022-10-09: 2 m[IU]/min via INTRAVENOUS
  Filled 2022-10-09: qty 500

## 2022-10-09 MED ORDER — LIDOCAINE HCL (PF) 1 % IJ SOLN
INTRAMUSCULAR | Status: DC | PRN
  Administered 2022-10-09: 11 mL via EPIDURAL

## 2022-10-09 MED ORDER — ACETAMINOPHEN 325 MG PO TABS
650.0000 mg | ORAL_TABLET | ORAL | Status: DC | PRN
  Administered 2022-10-09 – 2022-10-10 (×3): 650 mg via ORAL
  Filled 2022-10-09 (×3): qty 2

## 2022-10-09 MED ORDER — LACTATED RINGERS IV SOLN
500.0000 mL | Freq: Once | INTRAVENOUS | Status: AC
  Administered 2022-10-09: 500 mL via INTRAVENOUS

## 2022-10-09 MED ORDER — FENTANYL-BUPIVACAINE-NACL 0.5-0.125-0.9 MG/250ML-% EP SOLN
12.0000 mL/h | EPIDURAL | Status: DC | PRN
  Administered 2022-10-09: 12 mL/h via EPIDURAL
  Filled 2022-10-09: qty 250

## 2022-10-09 MED ORDER — PHENYLEPHRINE 80 MCG/ML (10ML) SYRINGE FOR IV PUSH (FOR BLOOD PRESSURE SUPPORT)
80.0000 ug | PREFILLED_SYRINGE | INTRAVENOUS | Status: DC | PRN
  Filled 2022-10-09: qty 10

## 2022-10-09 MED ORDER — COCONUT OIL OIL: 1.0000 | TOPICAL_OIL | Status: DC | PRN

## 2022-10-09 MED ORDER — LACTATED RINGERS IV SOLN
500.0000 mL | INTRAVENOUS | Status: DC | PRN
  Administered 2022-10-09: 500 mL via INTRAVENOUS

## 2022-10-09 MED ORDER — SOD CITRATE-CITRIC ACID 500-334 MG/5ML PO SOLN: 30.0000 mL | ORAL | Status: DC | PRN

## 2022-10-09 MED ORDER — PHENYLEPHRINE 80 MCG/ML (10ML) SYRINGE FOR IV PUSH (FOR BLOOD PRESSURE SUPPORT): 80.0000 ug | PREFILLED_SYRINGE | INTRAVENOUS | Status: DC | PRN

## 2022-10-09 MED ORDER — DIBUCAINE (PERIANAL) 1 % EX OINT: 1.0000 | TOPICAL_OINTMENT | CUTANEOUS | Status: DC | PRN

## 2022-10-09 MED ORDER — ONDANSETRON HCL 4 MG/2ML IJ SOLN
4.0000 mg | Freq: Four times a day (QID) | INTRAMUSCULAR | Status: DC | PRN
  Administered 2022-10-09: 4 mg via INTRAVENOUS
  Filled 2022-10-09 (×2): qty 2

## 2022-10-09 MED ORDER — DIPHENHYDRAMINE HCL 50 MG/ML IJ SOLN: 12.5000 mg | INTRAMUSCULAR | Status: DC | PRN

## 2022-10-09 MED ORDER — SENNOSIDES-DOCUSATE SODIUM 8.6-50 MG PO TABS
2.0000 | ORAL_TABLET | ORAL | Status: DC
  Administered 2022-10-10 – 2022-10-11 (×2): 2 via ORAL
  Filled 2022-10-09 (×2): qty 2

## 2022-10-09 MED ORDER — OXYTOCIN-SODIUM CHLORIDE 30-0.9 UT/500ML-% IV SOLN
2.5000 [IU]/h | INTRAVENOUS | Status: DC
  Administered 2022-10-09: 2.5 [IU]/h via INTRAVENOUS

## 2022-10-09 MED ORDER — ZOLPIDEM TARTRATE 5 MG PO TABS: 5.0000 mg | ORAL_TABLET | Freq: Every evening | ORAL | Status: DC | PRN

## 2022-10-09 MED ORDER — EPHEDRINE 5 MG/ML INJ: 10.0000 mg | INTRAVENOUS | Status: DC | PRN

## 2022-10-09 MED ORDER — LIDOCAINE HCL (PF) 1 % IJ SOLN: 30.0000 mL | INTRAMUSCULAR | Status: DC | PRN

## 2022-10-09 MED ORDER — OXYTOCIN-SODIUM CHLORIDE 30-0.9 UT/500ML-% IV SOLN: 1.0000 m[IU]/min | INTRAVENOUS | Status: DC

## 2022-10-09 MED ORDER — TETANUS-DIPHTH-ACELL PERTUSSIS 5-2.5-18.5 LF-MCG/0.5 IM SUSY: 0.5000 mL | PREFILLED_SYRINGE | Freq: Once | INTRAMUSCULAR | Status: DC

## 2022-10-09 MED ORDER — DIPHENHYDRAMINE HCL 25 MG PO CAPS: 25.0000 mg | ORAL_CAPSULE | Freq: Four times a day (QID) | ORAL | Status: DC | PRN

## 2022-10-09 MED ORDER — IBUPROFEN 600 MG PO TABS
600.0000 mg | ORAL_TABLET | Freq: Four times a day (QID) | ORAL | Status: DC
  Administered 2022-10-09 – 2022-10-11 (×7): 600 mg via ORAL
  Filled 2022-10-09 (×7): qty 1

## 2022-10-09 MED ORDER — PRENATAL MULTIVITAMIN CH
1.0000 | ORAL_TABLET | Freq: Every day | ORAL | Status: DC
  Administered 2022-10-10: 1 via ORAL
  Filled 2022-10-09: qty 1

## 2022-10-09 MED ORDER — ONDANSETRON HCL 4 MG/2ML IJ SOLN: 4.0000 mg | INTRAMUSCULAR | Status: DC | PRN

## 2022-10-09 MED ORDER — SIMETHICONE 80 MG PO CHEW: 80.0000 mg | CHEWABLE_TABLET | ORAL | Status: DC | PRN

## 2022-10-09 MED ORDER — ONDANSETRON HCL 4 MG/2ML IJ SOLN
4.0000 mg | Freq: Once | INTRAMUSCULAR | Status: AC
  Administered 2022-10-09: 4 mg via INTRAVENOUS

## 2022-10-09 MED ORDER — VALACYCLOVIR HCL 500 MG PO TABS
500.0000 mg | ORAL_TABLET | Freq: Two times a day (BID) | ORAL | Status: DC
  Administered 2022-10-09 – 2022-10-11 (×4): 500 mg via ORAL
  Filled 2022-10-09 (×4): qty 1

## 2022-10-09 MED ORDER — MEDROXYPROGESTERONE ACETATE 150 MG/ML IM SUSP: 150.0000 mg | INTRAMUSCULAR | Status: DC | PRN

## 2022-10-09 MED ORDER — LACTATED RINGERS IV SOLN: INTRAVENOUS | Status: DC

## 2022-10-09 MED ORDER — OXYTOCIN BOLUS FROM INFUSION
333.0000 mL | Freq: Once | INTRAVENOUS | Status: AC
  Administered 2022-10-09: 333 mL via INTRAVENOUS

## 2022-10-09 MED ORDER — WITCH HAZEL-GLYCERIN EX PADS: 1.0000 | MEDICATED_PAD | CUTANEOUS | Status: DC | PRN

## 2022-10-09 MED ORDER — ONDANSETRON HCL 4 MG PO TABS: 4.0000 mg | ORAL_TABLET | ORAL | Status: DC | PRN

## 2022-10-09 NOTE — H&P (Signed)
OBSTETRIC ADMISSION HISTORY AND PHYSICAL  Elizabeth [name] is a 27 y.o. female [phone] with IUP at [redacted]w[redacted]d by 20wk [place] presenting for IOL IUGR. She reports +FMs, No LOF, no VB, no blurry vision, headaches or peripheral edema, and RUQ pain.  She plans on breast feeding. She request depo for birth control. She received her prenatal care at  [facility]    Dating: By [place] --->  Estimated Date of Delivery: 10/21/22  Sono:    @[redacted]w[redacted]d , CWD, normal anatomy, cephalic presentation, 0000000, 7% EFW   Prenatal History/Complications:   Patient Active Problem List   Diagnosis Date Noted   Encounter for induction of labor 10/09/2022   Fetal growth restriction antepartum 08/24/2022   Supervision of other normal pregnancy, antepartum 05/11/2022   HSV infection 11/16/2020     Nursing Staff Provider  Office Location [place] Dating  10/21/2022, by Lodema Hong  [facility] Model [ ]  Traditional [ ]  Centering [ ]  Mom-Baby Dyad Anatomy US  normal  Language  English    Flu Vaccine   Genetic/Carrier Screen  NIPS:   LR female AFP:   neg Horizon: negative  TDaP Vaccine  08/03/22 Hgb A1C or  GTT Early  Third trimester - normal 2hr GTT  COVID Vaccine    LAB RESULTS   Rhogam  A/Positive/-- (10/31 [id number])  Blood Type A/Positive/-- (10/31 0926)   Baby Feeding Plan  Antibody Negative (10/31 0926)  Contraception  Rubella 1.81 (10/31 0926)  Circumcision  RPR Non Reactive (10/31 0926)   Pediatrician  [facility] HBsAg Negative (10/31 0926)   Support Person  HCVAb Non Reactive (10/31 [id number])   Prenatal Classes  HIV Non Reactive (10/31 0926)     BTL Consent  GBS   (For PCN allergy, check sensitivities)   VBAC Consent  Pap Diagnosis  Date Value Ref Range Status  05/11/2022   Final   - Negative for intraepithelial lesion or malignancy (NILM)         DME Rx [ ]  BP cuff [ ]  Weight Scale Waterbirth  [ ]  Class [ ]  Consent [ ]  CNM visit  PHQ9 & GAD7 [ x ] new OB [  ] 28 weeks  [  ] 36 weeks Induction  [ ]  Orders Entered [ ] Foley Y/N    Past Medical History: 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    pt reports last outbreak years ago   Vaginal Pap smear, abnormal     Past Surgical History: Past Surgical History:  Procedure Laterality Date   NO PAST SURGERIES      Obstetrical History: OB History     Gravida  6   Para  2   Term  1   Preterm  1   AB  3   Living  2      SAB  2   IAB  1   Ectopic      Multiple  0   Live Births  2           Social History Social History   Socioeconomic History   Marital status: Single    Spouse name: Not on file   Number of children: 2   Years of education: Not on file   Highest education level: Some college, no degree  Occupational History   Not on file  Tobacco Use   Smoking status: Some Days    Packs/day: .25    Types:  Cigarettes   Smokeless tobacco: Never  Vaping Use   Vaping Use: Some days  Substance and Sexual Activity   Alcohol use: Not Currently   Drug use: Not Currently   Sexual activity: Yes    Birth control/protection: Injection  Other Topics Concern   Not on file  Social History Narrative   Not on file   Social Determinants of Health   Financial Resource Strain: Not on file  Food Insecurity: No Food Insecurity (10/09/2022)   Hunger Vital Sign    Worried About Running Out of Food in the Last Year: Never true    Ran Out of Food in the Last Year: Never true  Transportation Needs: No Transportation Needs (10/09/2022)   PRAPARE - Hydrologist (Medical): No    Lack of Transportation (Non-Medical): No  Physical Activity: Not on file  Stress: Not on file  Social Connections: Not on file    Family History: Family History  Problem Relation Age of Onset   Stroke Mother    Hypertension Mother    Cancer Neg Hx    Diabetes Neg Hx    Asthma Neg Hx    Heart disease Neg Hx     Allergies: Allergies  Allergen Reactions   Kiwi  Extract Other (See Comments)    Numbness and tingling in mouth when eating kiwi    Medications Prior to Admission  Medication Sig Dispense Refill Last Dose   Prenatal Vit-Fe Fumarate-FA (PRENATAL VITAMINS PO) Take by mouth.   10/08/2022   valACYclovir (VALTREX) 500 MG tablet Take 1 tablet (500 mg total) by mouth 2 (two) times daily. 60 tablet 6 10/08/2022     Review of Systems   All systems reviewed and negative except as stated in HPI  unknown if currently breastfeeding. General appearance: alert Lungs: clear to auscultation bilaterally Heart: regular rate and rhythm Abdomen: soft, non-tender; bowel sounds normal Pelvic: no lesions on speculum exam Extremities: Homans sign is negative, no sign of DVT Presentation: cephalic Fetal monitoringBaseline: 140 bpm, Variability: Good {> 6 bpm), Accelerations: Reactive, and Decelerations: Absent Uterine activityFrequency: Every 5-7 minutes     Prenatal labs: ABO, Rh: --/--/PENDING (03/25 0732) Antibody: PENDING (03/25 0732) Rubella: 1.81 (10/31 0926) RPR: Non Reactive (01/18 1004)  HBsAg: Negative (10/31 0926)  HIV: Non Reactive (01/18 1004)  GBS: Negative/-- (03/21 1319)  1 hr Glucola nml Genetic screening  nml Anatomy US nml  Prenatal Transfer Tool  Maternal Diabetes: No Genetic Screening: Normal Maternal Ultrasounds/Referrals: IUGR Fetal Ultrasounds or other Referrals:  Referred to Materal Fetal Medicine  Maternal Substance Abuse:  No Significant Maternal Medications:  Meds include: Other: valtrex Significant Maternal Lab Results:  Group B Strep negative Number of Prenatal Visits:greater than 3 verified prenatal visits Other Comments:  None  Results for orders placed or performed during the hospital encounter of 10/09/22 (from the past 24 hour(s))  CBC   Collection Time: 10/09/22  7:32 AM  Result Value Ref Range   WBC 9.6 4.0 - 10.5 K/uL   RBC 3.85 (L) 3.87 - 5.11 MIL/uL   Hemoglobin 11.5 (L) 12.0 - 15.0 g/dL   HCT  33.5 (L) 36.0 - 46.0 %   MCV 87.0 80.0 - 100.0 fL   MCH 29.9 26.0 - 34.0 pg   MCHC 34.3 30.0 - 36.0 g/dL   RDW 13.7 11.5 - 15.5 %   Platelets 166 150 - 400 K/uL   nRBC 0.0 0.0 - 0.2 %  Type and screen  Collection Time: 10/09/22  7:32 AM  Result Value Ref Range   ABO/RH(D) PENDING    Antibody Screen PENDING    Sample Expiration      10/12/2022,2359 Performed at [facility], [place] [address] [place], [place] [address]     Patient Active Problem List   Diagnosis Date Noted   Encounter for induction of labor 10/09/2022   Fetal growth restriction antepartum 08/24/2022   Supervision of other normal pregnancy, antepartum 05/11/2022   HSV infection 11/16/2020    Assessment/Plan:  Elizabeth [name] is a 27 y.o. [phone] at [redacted]w[redacted]d here for IOL IUGR  #Labor: AROM clear 0800 #Pain: Per pt request #FWB: CAT 1 #ID:  GBS neg, HSV on valtrex, no lesions on speculum exam  #MOF: Breast #MOC:Depo  Shelda Pal, DO  10/09/2022, 8:02 AM

## 2022-10-09 NOTE — Lactation Note (Signed)
This note was copied from a baby's chart. Lactation Consultation Note  Patient Name: Elizabeth [name] [id number] Date: 10/09/2022 Age:28 hours Reason for consult: Initial assessment;Breastfeeding assistance;Mother's request;Early term 37-38.6wks;Infant < 6lbs (Infant is LBW less than 5 lbs 8ounces, infant is being BF and supplemented with 22 kcal formula.)  Birth Parent attempted to latch infant on her left breast using the cross cradle hold position, infant briefly latched for 3 minutes and became sleepy at the breast. Afterwards LC set Birth Parent up with DEBP, Birth Parent is using a 21 mm flange on her right breast and 24 mm breast flange on her left breast, Birth Parent expressed 19 mls of colostrum when pumping, infant was supplemented with 12 mls EBM using a purple and clear extra slow flow bottle nipple. Birth Parent knows to call [place] for further latch assistance if needed.  LC discussed infant's input and output, importance of maternal rest, diet and hydration. Birth Parent was  made aware of O/P services, breastfeeding support groups, community resources, and our phone # for post-discharge questions.    Birth Parent understand that infant feeding guidelines may change based on infant feeding behaviors.  LC discussed infant feeding guidelines for LPTI/LBW infant: 1- Birth Parent know to BF infant 8+ times within 24 hours, STS, continue to ask for latch assistance if needed, if infant doe not latch within 5 minutes or less or falls asleep at the breast to stop and supplement infant with any EBM first and then 22 kcal formula. Birth Parent will limit total feedings to 30 minutes or less, chest ( breast ) feeding for  15 minutes. 2- Birth Parent will continue to pump every 3 hours for 15 minutes on initial setting and give infant back any EBM first before offering 22 kcal formula. 3- Birth Parent was given "My Care plan" for LPTI/LBW and knows on Day 1 to supplement infant with (10-12 mls) or  more if infant wants it of EBM/22 kcal formula each feeding.   Maternal Data Has patient been taught Hand Expression?: Yes Does the patient have breastfeeding experience prior to this delivery?: Yes How long did the patient breastfeed?: Per Birth Parent, she BF previous two children for 1 month each, but plans to BF her 3rd child longer.  Feeding Mother's Current Feeding Choice: Breast Milk and Formula Nipple Type: Extra Slow Flow  LATCH Score Latch: Too sleepy or reluctant, no latch achieved, no sucking elicited.  Audible Swallowing: A few with stimulation  Type of Nipple: Everted at rest and after stimulation  Comfort (Breast/Nipple): Soft / non-tender  Hold (Positioning): Assistance needed to correctly position infant at breast and maintain latch.  LATCH Score: 6   Lactation Tools Discussed/Used Tools: Pump Breast pump type: Double-Electric Breast Pump Pump Education: Setup, frequency, and cleaning;Milk Storage Reason for Pumping: Infant is ETI infant with LBW Pumping frequency: Birth Parent plans to continue to use DEBP every 3 hours for 15 minutes on inital setting. Pumped volume: 17 mL  Interventions Interventions: Breast feeding basics reviewed;Adjust position;Support pillows;Assisted with latch;DEBP;Education;Position options;Skin to skin;Breast massage;Expressed milk;Pace feeding;Hand express;Breast compression;LPT handout/interventions;LC Services brochure  Discharge Pump: DEBP (Per Birth Parent, she has DEBP at home.)  Consult Status Consult Status: Follow-up Date: 10/10/22 Follow-up type: In-patient    Eulis Canner 10/09/2022, 7:16 PM

## 2022-10-09 NOTE — Discharge Summary (Signed)
Postpartum Discharge Summary  Date of Service updated***     Patient Name: Elizabeth [name] DOB: 01/28/1996 MRN: [id number]  Date of admission: 10/09/2022 Delivery date:10/09/2022  Delivering provider: Gavin Pound  Date of discharge: 10/09/2022  Admitting diagnosis: Encounter for induction of labor [Z34.90] Intrauterine pregnancy: [redacted]w[redacted]d     Secondary diagnosis:  Principal Problem:   Vaginal delivery Active Problems:   HSV infection   Supervision of other normal pregnancy, antepartum   Fetal growth restriction antepartum   Encounter for induction of labor   First degree perineal laceration during delivery, delivered  Additional problems: ***    Discharge diagnosis: {DX.:23714}                                              Post partum procedures:{Postpartum procedures:23558} Induction: AROM and Pitocin Complications: None  Hospital course: Induction of Labor With Vaginal Delivery   27 y.o. yo [id number] at [redacted]w[redacted]d was admitted to the hospital 10/09/2022 for induction of labor.  Indication for induction:  FGR . She presented and requested AROM which was appropriate as she was 4cm.  After 4 hours patient without any vaginal change and agreeable to pitocin.  Pitocin started and IUPC inserted. Patient delivered, without difficulties, ~ 3 hrs later.   Membrane Rupture Time/Date: 8:00 AM ,10/09/2022   Delivery Method:Vaginal, Spontaneous  Episiotomy: None  Lacerations:  1st degree  Details of delivery can be found in separate delivery note.  Patient had a postpartum course complicated by***. Patient is discharged home 10/09/22.  Newborn Data: Birth date:10/09/2022  Birth time:2:57 PM  Gender:Female  Living status:  Apgars: ,  Weight:   Magnesium Sulfate received: {Mag received:30440022} BMZ received: {BMZ received:30440023} Rhophylac:{Rhophylac received:30440032} [id number] T-DaP:{Tdap:23962} Flu: WG:1132360 Transfusion:{Transfusion received:30440034}  Physical exam   Vitals:   10/09/22 1331 10/09/22 1401 10/09/22 1431 10/09/22 1531  BP: (!) 104/58 (!) 111/55 108/61 118/62  Pulse: 60 60 62 85  Resp:  16    Temp:      TempSrc:      SpO2:      Weight:      Height:       General: {Exam; general:21111117} Lochia: {Desc; appropriate/inappropriate:30686::"appropriate"} Uterine Fundus: {Desc; firm/soft:30687} Incision: {Exam; incision:21111123} DVT Evaluation: {Exam; dvt:2111122} Labs: Lab Results  Component Value Date   WBC 9.6 10/09/2022   HGB 11.5 (L) 10/09/2022   HCT 33.5 (L) 10/09/2022   MCV 87.0 10/09/2022   PLT 166 10/09/2022       No data to display         Edinburgh Score:    03/24/2021    1:27 PM  Edinburgh Postnatal Depression Scale Screening Tool  I have been able to laugh and see the funny side of things. 0  I have looked forward with enjoyment to things. 0  I have blamed myself unnecessarily when things went wrong. 0  I have been anxious or worried for no good reason. 0  I have felt scared or panicky for no good reason. 0  Things have been getting on top of me. 0  I have been so unhappy that I have had difficulty sleeping. 0  I have felt sad or miserable. 0  I have been so unhappy that I have been crying. 0  The thought of harming myself has occurred to me. 0  Edinburgh Postnatal Depression Scale Total 0  After visit meds:  Allergies as of 10/09/2022       Reactions   Kiwi Extract Other (See Comments)   Numbness and tingling in mouth when eating kiwi     Med Rec must be completed prior to using this [place]***        Discharge home in stable condition Infant Feeding: {Baby feeding:23562} Infant Disposition:{CHL IP OB HOME WITH DX:[id number] Discharge instruction: per After Visit Summary and Postpartum booklet. Activity: Advance as tolerated. Pelvic rest for 6 weeks.  Diet: {OB BY:630183 Future Appointments: Future Appointments  Date Time Provider [place]  11/16/2022  1:50 PM  Constant, Vickii Chafe, MD [facility] None   Follow up Visit: Patient scheduled as above. Message sent for possible virtual option.    Please schedule this patient for a Virtual postpartum visit in  as scheduled  with the following provider:  P. Constant . Additional Postpartum F/U: None   High risk pregnancy complicated by:  None Delivery mode:  Vaginal, Spontaneous  Anticipated Birth Control:  Depo   10/09/2022 [name], CNM

## 2022-10-09 NOTE — Anesthesia Procedure Notes (Signed)
Epidural Patient location during procedure: OB Start time: 10/09/2022 8:45 AM End time: 10/09/2022 8:58 AM  Staffing Anesthesiologist: Lynda Rainwater, MD Performed: anesthesiologist   Preanesthetic Checklist Completed: patient identified, IV checked, site marked, risks and benefits discussed, surgical consent, monitors and equipment checked, pre-op evaluation and timeout performed  Epidural Patient position: sitting Prep: ChloraPrep Patient monitoring: heart rate, cardiac monitor, continuous pulse ox and blood pressure Approach: midline Location: L2-L3 Injection technique: LOR saline  Needle:  Needle type: Tuohy  Needle gauge: 17 G Needle length: 9 cm Needle insertion depth: 4 cm Catheter type: closed end flexible Catheter size: 20 Guage Catheter at skin depth: 8 cm Test dose: negative  Assessment Events: blood not aspirated, injection not painful, no injection resistance, no paresthesia and negative IV test  Additional Notes Reason for block:procedure for pain

## 2022-10-09 NOTE — Anesthesia Preprocedure Evaluation (Signed)
Anesthesia Evaluation  Patient identified by MRN, date of birth, ID band Patient awake    Reviewed: Allergy & Precautions, H&P , Patient's Chart, lab work & pertinent test results  Airway Mallampati: I       Dental no notable dental hx.    Pulmonary Current Smoker   Pulmonary exam normal        Cardiovascular negative cardio ROS Normal cardiovascular exam     Neuro/Psych negative neurological ROS  negative psych ROS   GI/Hepatic negative GI ROS, Neg liver ROS,,,  Endo/Other  negative endocrine ROS    Renal/GU negative Renal ROS  negative genitourinary   Musculoskeletal negative musculoskeletal ROS (+)    Abdominal Normal abdominal exam  (+)   Peds  Hematology negative hematology ROS (+)   Anesthesia Other Findings   Reproductive/Obstetrics (+) Pregnancy                              Anesthesia Physical Anesthesia Plan  ASA: 2  Anesthesia Plan: Epidural   Post-op Pain Management:    Induction:   PONV Risk Score and Plan:   Airway Management Planned:   Additional Equipment:   Intra-op Plan:   Post-operative Plan:   Informed Consent: I have reviewed the patients History and Physical, chart, labs and discussed the procedure including the risks, benefits and alternatives for the proposed anesthesia with the patient or authorized representative who has indicated his/her understanding and acceptance.       Plan Discussed with:   Anesthesia Plan Comments:          Anesthesia Quick Evaluation

## 2022-10-09 NOTE — Progress Notes (Signed)
Elizabeth [name] MRN: [id number]  Subjective: -Patient resting in bed and reports continued comfort with epidural.  Reports fear of pitocin from what she has seen on social media. She endorses fetal movement.   Objective: BP (!) 109/51   Pulse 67   Temp 98.3 F (36.8 C) (Oral)   Resp 17   Ht 5\' 3"  (1.6 m)   Wt 60.4 kg   SpO2 100%   BMI 23.58 kg/m  No intake/output data recorded. No intake/output data recorded.  Fetal Monitoring: FHT: 135 bpm, Mod Var, -Decels, +Accels UC: Occasional     Vaginal Exam: SVE:   Dilation: 4 Effacement (%): 60 Station: -3 Exam by:: Pansie Guggisberg, CNM Membranes: AROM Internal Monitors: IUPC placed  Augmentation/Induction: Pitocin:Initiated Cytotec: None  Assessment:  IUP at 38.2 weeks Cat I FT  Labor Induction  Plan: -Extensive education on pitocin usage including risks, benefits, and administration.  -Addressed concerns and questions.  -Patient agreeable with implementation of pitocin. -Discussed IUPC r/b, prior to insertion, including increased risk of infection and ability to adequately monitor quantity and strength of contractions. -Patient without questions and agreeable. -IUPC inserted without difficulty -Pitocin ordered at 2x1 and nurse instructed to reassess and increase every hour to reduce risk of fetal intolerance. -Continue other mgmt as ordered   [name], [place] Provider, [facility] [place] 10/09/2022, 11:49 AM

## 2022-10-09 NOTE — Progress Notes (Signed)
Elizabeth [name] MRN: [id number]  Subjective: -Care assumed of 27 y.o. [id number] at [redacted]w[redacted]d who presents for IOL s/t FGR. In room to meet acquaintance of patient and family.  Patient resting in bed.  Reports epidural in place, but was not having pain prior to insertion.   Objective: BP 112/60   Pulse 73   Temp 98.3 F (36.8 C) (Oral)   Resp 17   Ht 5\' 3"  (1.6 m)   Wt 60.4 kg   SpO2 100%   BMI 23.58 kg/m  No intake/output data recorded. No intake/output data recorded.  Fetal Monitoring: FHT: 135 bpm, Mod Var, -Decels, +Accels UC: Irregular    Physical Exam: General appearance: alert, well appearing, and in no distress. Chest: not examined.  not examined. Abdominal exam: Soft, NT, Gravid. Extremities: No edema Skin exam: Warm Dry  Vaginal Exam: SVE:   Dilation: 4 Effacement (%): 60 Station: -3 Exam by:: Elizabeth Cookey, MD Membranes:AROM x 1.5 hrs Internal Monitors: None  Augmentation/Induction: Pitocin:None Cytotec: None  Assessment:  IUP at 38.2 weeks Cat I FT  FGR Afebrile  Plan: -Patient desiring to avoid pitocin usage -Will continue to monitor in hopes of continued progression with AROM. -Plan to reevaluate in 4-6 hrs as appropriate.  -Continue other mgmt as ordered   Elizabeth [name], CNM 10/09/2022, 9:46 AM

## 2022-10-10 LAB — CBC
HCT: 30 % — ABNORMAL LOW (ref 36.0–46.0)
Hemoglobin: 10.3 g/dL — ABNORMAL LOW (ref 12.0–15.0)
MCH: 30.1 pg (ref 26.0–34.0)
MCHC: 34.3 g/dL (ref 30.0–36.0)
MCV: 87.7 fL (ref 80.0–100.0)
Platelets: 153 10*3/uL (ref 150–400)
RBC: 3.42 MIL/uL — ABNORMAL LOW (ref 3.87–5.11)
RDW: 13.8 % (ref 11.5–15.5)
WBC: 15.1 10*3/uL — ABNORMAL HIGH (ref 4.0–10.5)
nRBC: 0 % (ref 0.0–0.2)

## 2022-10-10 NOTE — Lactation Note (Signed)
This note was copied from a baby's chart. Lactation Consultation Note  Patient Name: [name] [id number] Date: 10/10/2022 Age:28 hours Reason for consult: Follow-up assessment;Infant < 6lbs;Early term 23-38.6wks Mom was burping baby when LC came into rm. Mom stated she had put the baby on the breast for 5 minutes on each side. Mom had pumped 5 ml and gave that to the baby first then she gave the formula.  Praised mom for all her hard work. Suggested when mom's mature milk comes in BF on one side and rotate breast per feeding in order to get the hind milk. Encouraged mom to call for Lactation to see a latch per day or if needs assistance.   Maternal Data    Feeding Mother's Current Feeding Choice: Breast Milk and Formula Nipple Type: Nfant Slow Flow (purple)  LATCH Score                    Lactation Tools Discussed/Used Tools: Pump Breast pump type: Double-Electric Breast Pump Pumping frequency: q3hr Pumped volume: 5 mL  Interventions    Discharge    Consult Status Consult Status: Follow-up Date: 10/11/22 Follow-up type: In-patient    Theodoro Kalata 10/10/2022, 10:01 PM

## 2022-10-10 NOTE — Anesthesia Postprocedure Evaluation (Signed)
Anesthesia Post Note  Patient: Elizabeth [name]  Procedure(s) Performed: AN AD HOC LABOR EPIDURAL     Patient location during evaluation: Mother Baby Anesthesia Type: Epidural Level of consciousness: awake and alert and oriented Pain management: satisfactory to patient Vital Signs Assessment: post-procedure vital signs reviewed and stable Respiratory status: respiratory function stable Cardiovascular status: stable Postop Assessment: no headache, no backache, epidural receding, patient able to bend at knees, no signs of nausea or vomiting, adequate PO intake and able to ambulate Anesthetic complications: no   No notable events documented.  Last Vitals:  Vitals:   10/10/22 0209 10/10/22 0538  BP: 109/62 100/60  Pulse: (!) 56 63  Resp: 18 18  Temp: 36.6 C 36.7 C  SpO2: 100% 100%    Last Pain:  Vitals:   10/10/22 0538  TempSrc: Axillary  PainSc: 0-No pain   Pain Goal:                   Ronan Duecker

## 2022-10-10 NOTE — Progress Notes (Signed)
Post Partum Day 1 Subjective: no complaints, up ad lib, and tolerating PO  Objective: Blood pressure 100/60, pulse 63, temperature 98 F (36.7 C), temperature source Axillary, resp. rate 18, height 5\' 3"  (1.6 m), weight 60.4 kg, SpO2 100 %, unknown if currently breastfeeding.  Physical Exam:  General: alert, cooperative, and no distress Lochia: appropriate Uterine Fundus: firm Incision: n/a DVT Evaluation: No evidence of DVT seen on physical exam.  Recent Labs    10/09/22 0732 10/10/22 0436  HGB 11.5* 10.3*  HCT 33.5* 30.0*    Assessment/Plan: Plan for discharge tomorrow   LOS: 1 day   Truett Mainland, DO 10/10/2022, 12:41 PM

## 2022-10-11 ENCOUNTER — Encounter: Payer: Medicaid Other | Admitting: Family Medicine

## 2022-10-11 ENCOUNTER — Other Ambulatory Visit (HOSPITAL_BASED_OUTPATIENT_CLINIC_OR_DEPARTMENT_OTHER): Payer: Self-pay

## 2022-10-11 LAB — SURGICAL PATHOLOGY

## 2022-10-11 MED ORDER — IBUPROFEN 600 MG PO TABS
600.0000 mg | ORAL_TABLET | Freq: Four times a day (QID) | ORAL | 0 refills | Status: DC
  Filled 2022-10-11: qty 30, 8d supply, fill #0

## 2022-10-16 DIAGNOSIS — Z419 Encounter for procedure for purposes other than remedying health state, unspecified: Secondary | ICD-10-CM | POA: Diagnosis not present

## 2022-10-19 ENCOUNTER — Telehealth (HOSPITAL_COMMUNITY): Payer: Self-pay

## 2022-10-19 ENCOUNTER — Encounter: Payer: Medicaid Other | Admitting: Family Medicine

## 2022-10-19 ENCOUNTER — Telehealth (HOSPITAL_COMMUNITY): Payer: Self-pay | Admitting: *Deleted

## 2022-10-19 NOTE — Telephone Encounter (Signed)
Attempted Hospital Discharge Follow-Up Call.  Left voice mail requesting that patient return RN's phone call if patient has any concerns or questions.  

## 2022-10-19 NOTE — Telephone Encounter (Signed)
Chart review.

## 2022-11-15 DIAGNOSIS — Z419 Encounter for procedure for purposes other than remedying health state, unspecified: Secondary | ICD-10-CM | POA: Diagnosis not present

## 2022-11-16 ENCOUNTER — Ambulatory Visit: Payer: Medicaid Other | Admitting: Obstetrics and Gynecology

## 2022-11-16 NOTE — Progress Notes (Deleted)
    Post Partum Visit Note  Elizabeth [name] is a 27 y.o. [phone] female who presents for a postpartum visit. She is 5 weeks postpartum following a normal spontaneous vaginal delivery.  I have fully reviewed the prenatal and intrapartum course. The delivery was at 38.2 gestational weeks.  Anesthesia: epidural. Postpartum course has been ***. Baby is doing well. Baby is feeding by {breastmilk/bottle:69}. Bleeding {vag bleed:12292}. Bowel function is {normal:32111}. Bladder function is {normal:32111}. Patient {is/is not:9024} sexually active. Contraception method is {contraceptive method:5051}. Postpartum depression screening: {gen negative/positive:315881}.   The pregnancy intention screening data noted above was reviewed. Potential methods of contraception were discussed. The patient elected to proceed with No data recorded.    Health Maintenance Due  Topic Date Due   COVID-19 Vaccine (1) Never done    {Common ambulatory SmartLinks:19316}  Review of Systems {ros; complete:30496}  Objective:  There were no vitals taken for this visit.   General:  {gen appearance:16600}   Breasts:  {desc; normal/abnormal/not indicated:14647}  Lungs: {lung exam:16931}  Heart:  {heart exam:5510}  Abdomen: {abdomen exam:16834}   Wound {Wound assessment:11097}  GU exam:  {desc; normal/abnormal/not indicated:14647}       Assessment:    There are no diagnoses linked to this encounter.  *** postpartum exam.   Plan:   Essential components of care per ACOG recommendations:  1.  Mood and well being: Patient with {gen negative/positive:315881} depression screening today. Reviewed local resources for support.  - Patient tobacco use? {tobacco use:25506}  - hx of drug use? {yes/no:25505}    2. Infant care and feeding:  -Patient currently breastmilk feeding? {yes/no:25502}  -Social determinants of health (SDOH) reviewed in EPIC. No concerns***The following needs were identified***  3. Sexuality,  contraception and birth spacing - Patient {DOES_DOES YNW:29562} want a pregnancy in the next year.  Desired family size is {NUMBER 1-10:22536} children.  - Reviewed reproductive life planning. Reviewed contraceptive methods based on pt preferences and effectiveness.  Patient desired {Up[employer] End Methods:24109} today.   - Discussed birth spacing of 18 months  4. Sleep and fatigue -Encouraged family/partner/community support of 4 hrs of uninterrupted sleep to help with mood and fatigue  5. Physical Recovery  - Discussed patients delivery and complications. She describes her labor as {description:25511} - Patient had a {CHL AMB DELIVERY:[phone]}. Patient had a {laceration:25518} laceration. Perineal healing reviewed. Patient expressed understanding - Patient has urinary incontinence? {yes/no:25515} - Patient {ACTION; IS/IS ZHY:[id number]} safe to resume physical and sexual activity  6.  Health Maintenance - HM due items addressed {Yes or If no, why not?:20788} - Last pap smear  Diagnosis  Date Value Ref Range Status  05/11/2022   Final   - Negative for intraepithelial lesion or malignancy (NILM)   Pap smear {done:10129} at today's visit.  -Breast Cancer screening indicated? {indicated:25516}  7. Chronic Disease/Pregnancy Condition follow up: {Follow up:25499}  - PCP follow up  Armandina Stammer, RN [facility] [employer], [facility]

## 2022-12-16 DIAGNOSIS — Z419 Encounter for procedure for purposes other than remedying health state, unspecified: Secondary | ICD-10-CM | POA: Diagnosis not present

## 2022-12-29 ENCOUNTER — Ambulatory Visit (INDEPENDENT_AMBULATORY_CARE_PROVIDER_SITE_OTHER): Payer: Medicaid Other | Admitting: Family Medicine

## 2022-12-29 ENCOUNTER — Encounter: Payer: Self-pay | Admitting: Family Medicine

## 2022-12-29 DIAGNOSIS — Z30013 Encounter for initial prescription of injectable contraceptive: Secondary | ICD-10-CM

## 2022-12-29 DIAGNOSIS — Z3202 Encounter for pregnancy test, result negative: Secondary | ICD-10-CM

## 2022-12-29 LAB — POCT URINE PREGNANCY: Preg Test, Ur: NEGATIVE

## 2022-12-29 MED ORDER — MEDROXYPROGESTERONE ACETATE 150 MG/ML IM SUSP
150.0000 mg | Freq: Once | INTRAMUSCULAR | Status: AC
  Administered 2022-12-29: 150 mg via INTRAMUSCULAR

## 2022-12-29 NOTE — Progress Notes (Signed)
Post Partum Visit Note  Daniqua Rosselli is a 27 y.o. [phone] female who presents for a postpartum visit. She is several weeks postpartum following a normal spontaneous vaginal delivery.  I have fully reviewed the prenatal and intrapartum course. The delivery was at 38 gestational weeks.  Anesthesia: epidural. Postpartum course has been normal. Baby is doing well. Baby is feeding by bottle Similac Neosure . Bleeding no bleeding. Bowel function is normal. Bladder function is normal. Patient is sexually active. Contraception method is Depo-Provera injections. Postpartum depression screening: negative.   The pregnancy intention screening data noted above was reviewed. Potential methods of contraception were discussed. The patient elected to proceed with No data recorded.   Edinburgh Postnatal Depression Scale - 12/29/22 0941       Edinburgh Postnatal Depression Scale:  In the Past 7 Days   I have been able to laugh and see the funny side of things. 0    I have looked forward with enjoyment to things. 0    I have blamed myself unnecessarily when things went wrong. 0    I have been anxious or worried for no good reason. 0    I have felt scared or panicky for no good reason. 0    Things have been getting on top of me. 1    I have been so unhappy that I have had difficulty sleeping. 0    I have felt sad or miserable. 0    I have been so unhappy that I have been crying. 0    The thought of harming myself has occurred to me. 0    Edinburgh Postnatal Depression Scale Total 1             Health Maintenance Due  Topic Date Due   COVID-19 Vaccine (1) Never done    The following portions of the patient's history were reviewed and updated as appropriate: allergies, current medications, past family history, past medical history, past social history, past surgical history, and problem list.  Review of Systems Pertinent items are noted in HPI.  Objective:  BP 113/63   Pulse 70   Wt 113  lb (51.3 kg)   Breastfeeding No   BMI 20.02 kg/m    General:  alert, cooperative, and no distress   Breasts:  not indicated  Lungs: clear to auscultation bilaterally  Heart:  regular rate and rhythm, S1, S2 normal, no murmur, click, rub or gallop  Abdomen: soft, non-tender; bowel sounds normal; no masses,  no organomegaly        Assessment:   1. Postpartum care and examination - POCT urine pregnancy - medroxyPROGESTERone (DEPO-PROVERA) injection 150 mg  2. Encounter for initial prescription of injectable contraceptive - POCT urine pregnancy - medroxyPROGESTERone (DEPO-PROVERA) injection 150 mg   Plan:   Essential components of care per ACOG recommendations:  1.  Mood and well being: Patient with negative depression screening today. Reviewed local resources for support.  - Patient tobacco use? No.   - hx of drug use? No.    2. Infant care and feeding:  -Patient currently breastmilk feeding? No.  -Social determinants of health (SDOH) reviewed in EPIC. No concerns  3. Sexuality, contraception and birth spacing - Patient does not want a pregnancy in the next year.    - Reviewed reproductive life planning. Reviewed contraceptive methods based on pt preferences and effectiveness.  Patient desired Hormonal Injection today.   - Discussed birth spacing of 18 months  4. Sleep and fatigue -  Encouraged family/partner/community support of 4 hrs of uninterrupted sleep to help with mood and fatigue  5. Physical Recovery  - Discussed patients delivery and complications. She describes her labor as good. - Patient had a Vaginal, no problems at delivery. Patient had a 1st degree laceration. Perineal healing reviewed. Patient expressed understanding - Patient has urinary incontinence? No. - Patient is safe to resume physical and sexual activity  6.  Health Maintenance - HM due items addressed Yes - Last pap smear  Diagnosis  Date Value Ref Range Status  05/11/2022   Final   - Negative  for intraepithelial lesion or malignancy (NILM)   Pap smear not done at today's visit.  -Breast Cancer screening indicated? No.   7. Chronic Disease/Pregnancy Condition follow up: None  - PCP follow up  Levie Heritage, DO [facility] for [facility], [facility]

## 2023-01-15 DIAGNOSIS — Z419 Encounter for procedure for purposes other than remedying health state, unspecified: Secondary | ICD-10-CM | POA: Diagnosis not present

## 2023-02-15 DIAGNOSIS — Z419 Encounter for procedure for purposes other than remedying health state, unspecified: Secondary | ICD-10-CM | POA: Diagnosis not present

## 2023-03-18 DIAGNOSIS — Z419 Encounter for procedure for purposes other than remedying health state, unspecified: Secondary | ICD-10-CM | POA: Diagnosis not present

## 2023-03-20 ENCOUNTER — Ambulatory Visit: Payer: Medicaid Other

## 2023-03-26 ENCOUNTER — Other Ambulatory Visit (HOSPITAL_BASED_OUTPATIENT_CLINIC_OR_DEPARTMENT_OTHER): Payer: Self-pay

## 2023-03-26 ENCOUNTER — Ambulatory Visit (INDEPENDENT_AMBULATORY_CARE_PROVIDER_SITE_OTHER): Payer: Medicaid Other

## 2023-03-26 ENCOUNTER — Other Ambulatory Visit: Payer: Self-pay

## 2023-03-26 VITALS — BP 122/83 | HR 70 | Wt 113.0 lb

## 2023-03-26 DIAGNOSIS — Z3042 Encounter for surveillance of injectable contraceptive: Secondary | ICD-10-CM

## 2023-03-26 MED ORDER — MEDROXYPROGESTERONE ACETATE 150 MG/ML IM SUSP
150.0000 mg | Freq: Once | INTRAMUSCULAR | Status: AC
  Administered 2023-03-26: 150 mg via INTRAMUSCULAR

## 2023-03-26 MED ORDER — MEDROXYPROGESTERONE ACETATE 150 MG/ML IM SUSY
150.0000 mg | PREFILLED_SYRINGE | INTRAMUSCULAR | 3 refills | Status: DC
  Filled 2023-03-26 (×2): qty 1, 90d supply, fill #0

## 2023-03-26 NOTE — Progress Notes (Signed)
Elizabeth [name] here for Depo-Provera  Injection.  Injection administered without complication. Patient will return in 3 months for next injection.  [name], CMA 03/26/2023  3:58 PM

## 2023-04-17 ENCOUNTER — Other Ambulatory Visit (HOSPITAL_COMMUNITY)
Admission: RE | Admit: 2023-04-17 | Discharge: 2023-04-17 | Disposition: A | Discharge status: Home / Self Care | Payer: Medicaid Other | Source: Ambulatory Visit | Attending: Family Medicine | Admitting: Family Medicine

## 2023-04-17 ENCOUNTER — Other Ambulatory Visit (HOSPITAL_BASED_OUTPATIENT_CLINIC_OR_DEPARTMENT_OTHER): Payer: Self-pay

## 2023-04-17 ENCOUNTER — Ambulatory Visit: Payer: Medicaid Other

## 2023-04-17 VITALS — BP 132/83 | HR 82 | Ht 64.0 in | Wt 116.0 lb

## 2023-04-17 DIAGNOSIS — Z419 Encounter for procedure for purposes other than remedying health state, unspecified: Secondary | ICD-10-CM | POA: Diagnosis not present

## 2023-04-17 DIAGNOSIS — A6000 Herpesviral infection of urogenital system, unspecified: Secondary | ICD-10-CM

## 2023-04-17 DIAGNOSIS — Z113 Encounter for screening for infections with a predominantly sexual mode of transmission: Secondary | ICD-10-CM | POA: Diagnosis not present

## 2023-04-17 MED ORDER — VALACYCLOVIR HCL 500 MG PO TABS
500.0000 mg | ORAL_TABLET | Freq: Every day | ORAL | 12 refills | Status: AC
  Filled 2023-04-17 – 2023-08-10 (×2): qty 30, 30d supply, fill #0

## 2023-04-17 NOTE — Progress Notes (Signed)
SUBJECTIVE:  27 y.o. female who desires a STI screen due to new partner. Patient complains of recurrent HSV outbreak.  Denies abnormal vaginal discharge, bleeding or significant pelvic pain. No UTI symptoms. Denies history of known exposure to STD.  Patient's last menstrual period was 04/17/2023 (approximate).  OBJECTIVE:  She appears well.   ASSESSMENT:  STI Screen   PLAN:  Pt offered STI blood screening-ordered GC, chlamydia, and trichomonas probe sent to lab.  Treatment: To be determined once lab results are received. Valtrex 500mg  PO twice a day for 5 days, then may take one tablet by mouth daily per Dr. Macon Large.  Pt follow up as needed.

## 2023-04-18 LAB — HEPATITIS C ANTIBODY: Hep C Virus Ab: NONREACTIVE

## 2023-04-18 LAB — CERVICOVAGINAL ANCILLARY ONLY
Chlamydia: NEGATIVE
Comment: NEGATIVE
Comment: NEGATIVE
Comment: NORMAL
Neisseria Gonorrhea: NEGATIVE
Trichomonas: NEGATIVE

## 2023-04-18 LAB — RPR: RPR Ser Ql: NONREACTIVE

## 2023-04-18 LAB — HEPATITIS B SURFACE ANTIGEN: Hepatitis B Surface Ag: NEGATIVE

## 2023-04-18 LAB — HIV ANTIBODY (ROUTINE TESTING W REFLEX): HIV Screen 4th Generation wRfx: NONREACTIVE

## 2023-05-02 ENCOUNTER — Other Ambulatory Visit (HOSPITAL_BASED_OUTPATIENT_CLINIC_OR_DEPARTMENT_OTHER): Payer: Self-pay

## 2023-05-09 ENCOUNTER — Other Ambulatory Visit: Payer: Self-pay

## 2023-05-09 ENCOUNTER — Encounter (HOSPITAL_BASED_OUTPATIENT_CLINIC_OR_DEPARTMENT_OTHER): Payer: Self-pay

## 2023-05-09 ENCOUNTER — Emergency Department (HOSPITAL_BASED_OUTPATIENT_CLINIC_OR_DEPARTMENT_OTHER)
Admission: EM | Admit: 2023-05-09 | Discharge: 2023-05-09 | Disposition: A | Discharge status: Home / Self Care | Payer: Medicaid Other | Attending: Emergency Medicine | Admitting: Emergency Medicine

## 2023-05-09 DIAGNOSIS — R1012 Left upper quadrant pain: Secondary | ICD-10-CM | POA: Diagnosis not present

## 2023-05-09 DIAGNOSIS — R198 Other specified symptoms and signs involving the digestive system and abdomen: Secondary | ICD-10-CM

## 2023-05-09 DIAGNOSIS — R109 Unspecified abdominal pain: Secondary | ICD-10-CM | POA: Insufficient documentation

## 2023-05-09 LAB — URINALYSIS, MICROSCOPIC (REFLEX)

## 2023-05-09 LAB — URINALYSIS, ROUTINE W REFLEX MICROSCOPIC
Glucose, UA: NEGATIVE mg/dL
Hgb urine dipstick: NEGATIVE
Ketones, ur: NEGATIVE mg/dL
Leukocytes,Ua: NEGATIVE
Nitrite: NEGATIVE
Protein, ur: 100 mg/dL — AB
Specific Gravity, Urine: 1.02 (ref 1.005–1.030)
pH: 7 (ref 5.0–8.0)

## 2023-05-09 LAB — PREGNANCY, URINE: Preg Test, Ur: NEGATIVE

## 2023-05-09 NOTE — ED Provider Notes (Signed)
Elizabeth [name]   CSN: [id number] Arrival date & time: 05/09/23  1557     History  Chief Complaint  Patient presents with   Abdominal Pain    Elizabeth [name] is a 27 y.o. 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  Patient states is most commonly in the right lower quadrant however is noticed in the left upper quadrant and today coming out of her left chest wall cavity.  Patient notices these when she bends over and is able to push them back and.  Patient denies nausea vomiting, chest pain, shortness of breath, constipation, diarrhea, dysuria, hematuria, flank pain, abdominal pain.  Patient states that she is concerned that she is hernias and wants to be evaluated for such.  Home Medications Prior to Admission medications   Medication Sig Start Date End Date Taking? Authorizing Provider  medroxyPROGESTERone Acetate 150 MG/ML SUSY Inject 1 mL (150 mg total) into the muscle every 3 (three) months. 03/26/23   Milas Hock, MD  valACYclovir (VALTREX) 500 MG tablet Take 1 tablet (500 mg total) by mouth daily. Can increase to twice a day for 5 days in the event of a recurrence. 04/17/23   Levie Heritage, DO      Allergies    Kiwi extract    Review of Systems   Review of Systems  Gastrointestinal:  Positive for abdominal pain.    Physical Exam Updated Vital Signs BP (!) 132/92 (BP Location: Right Arm)   Pulse 78   Temp 98.9 F (37.2 C) (Oral)   Resp 18   Ht 5\' 4"  (1.626 m)   Wt 53.5 kg   LMP 04/17/2023 (Approximate)   SpO2 95%   BMI 20.25 kg/m  Physical Exam Exam conducted with a chaperone present Maralyn Sago Smoot, PA-C).  Constitutional:      General: She is not in acute distress. Cardiovascular:     Rate and Rhythm: Normal rate and regular rhythm.     Pulses: Normal pulses.     Heart sounds: Normal heart sounds.  Pulmonary:     Effort: Pulmonary effort is normal. No  respiratory distress.     Breath sounds: Normal breath sounds.  Chest:     Comments: Chaperone present When palpating chest wall did not appear to be any hernias or obvious abnormalities, no bony abnormalities or muscular defects noted Abdominal:     General: There is no distension.     Palpations: Abdomen is soft. There is no mass.     Tenderness: There is no abdominal tenderness. There is no guarding or rebound.     Hernia: No hernia is present.     Comments: Patient bent over I do not appreciate any hernias to palpation and patient did not have any abdominal tenderness or peritoneal signs While patient was lying down patient had negative Murphy sign, no abdominal tenderness or peritoneal signs, no signs of hernias  Musculoskeletal:        General: Normal range of motion.  Lymphadenopathy:     Comments: No inguinal lymphadenopathy noted  Skin:    General: Skin is warm and dry.     Capillary Refill: Capillary refill takes less than 2 seconds.     Comments: No overlying skin color changes  Neurological:     Mental Status: She is alert and oriented to person, place, and time.     ED Results / Procedures / Treatments   Labs (all  labs ordered are listed, but only abnormal results are displayed) Labs Reviewed  URINALYSIS, ROUTINE W REFLEX MICROSCOPIC  PREGNANCY, URINE    EKG None  Radiology No results found.  Procedures Procedures    Medications Ordered in ED Medications - No data to display  ED Course/ Medical Decision Making/ A&P                                 Medical Decision Making Amount and/or Complexity of Data Reviewed Labs: ordered.   Elroy Channel 27 y.o. presented today for reported hernias. Working DDx that I considered at this time includes, but not limited to, lymphadenopathy, hernia, cellulitis, appendicitis, cholecystitis, gastritis, UTI, pregnancy.  R/o DDx: lymphadenopathy, hernia, cellulitis, appendicitis, cholecystitis, gastritis, UTI,  pregnancy: These are considered less likely due to history of present illness, physical exam, labs/imaging findings  Review of prior external notes: 10/11/2022 discharge  Unique Tests and My Interpretation:  UA: Unremarkable Urine pregnancy: Negative  Social Determinants of Health: none  Discussion with Independent Historian: None  Discussion of Management of Tests: None  Risk: Low: based on diagnostic testing/clinical impression and treatment plan  Risk Stratification Score: None  Staffed with Long, MD  Plan: On exam patient was in no acute distress with stable vitals.  With a chaperone in the room I had the patient's stand and bend over as patient states that when she bends over she notices these hernias.  I did not appreciate any hernias or any abnormalities on exam and have the patient lie down and inspect patient's chest wall along with abdominal wall and did not observe any structural abnormalities there.  This is going on for 6 months and patient states that she is able to push these "hernias "back in which is reassuring if she is in fact having hernias however exiting the attending we agreed the patient does not need emergent imaging and can follow-up with a primary care provider if it continues to bother her they may discuss outpatient imaging.  The rest of patient's physical was also unremarkable.  Will get a UA and urine pregnancy as triage [name] states that patient is having abdominal pain with flank pain however patient not have any CVA tenderness or endorse flank or abdominal pain with me and anticipating that this will be reassuring will discharge with primary care follow-up.  Urine and UPT negative.  Will discharge with the above plan.  Patient was given return precautions. Patient stable for discharge at this time.  Patient verbalized understanding of plan.  This chart was dictated using voice recognition software.  Despite best efforts to proofread,  errors can occur which  can change the documentation meaning.         Final Clinical Impression(s) / ED Diagnoses Final diagnoses:  Abdominal complaints    Rx / DC Orders ED Discharge Orders     None         Remi Deter 05/09/23 1741    Maia Plan, MD 05/15/23 1052

## 2023-05-09 NOTE — Discharge Instructions (Signed)
With follow-up with the primary care provider of attached your for you today.  Today your exam was reassuring and we did not notice any hernias however continue to monitor your symptoms and if symptoms change or worsen please return to the ER.

## 2023-05-09 NOTE — ED Triage Notes (Signed)
States the past 6 months she has intermittently had a "bulging out" of RLQ that she reduces on her own, states may be hernia.   Today started having bulging of LUQ & RLQ and generalized abdominal cramping & left flank pain.

## 2023-05-11 NOTE — Progress Notes (Unsigned)
New patient visit   Patient: Elizabeth [name]   DOB: 1995-11-25   27 y.o. Female  MRN: [id number] Visit Date: 05/15/2023  Today's healthcare provider: Alfredia Ferguson, PA-C   No chief complaint on file.  Subjective    Elizabeth [name] is a 27 y.o. female who presents today as a new patient to establish care.   Pt was seen in the ED 05/09/23 with concerns over reducible abdominal hernias. As nothing concerning was seen during the exam, no imaging was completed and patient was recommended to follow up out patient.   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    pt reports last outbreak years ago   Vaginal Pap smear, abnormal    Past Surgical History:  Procedure Laterality Date   NO PAST SURGERIES     Family Status  Relation Name Status   Mother  (Not Specified)   Neg Hx  (Not Specified)  No partnership data on file   Family History  Problem Relation Age of Onset   Stroke Mother    Hypertension Mother    Cancer Neg Hx    Diabetes Neg Hx    Asthma Neg Hx    Heart disease Neg Hx    Social History   Socioeconomic History   Marital status: Single    Spouse name: Not on file   Number of children: 2   Years of education: Not on file   Highest education level: Some college, no degree  Occupational History   Not on file  Tobacco Use   Smoking status: Former    Current packs/day: 0.25    Types: Cigarettes   Smokeless tobacco: Never  Vaping Use   Vaping status: Some Days  Substance and Sexual Activity   Alcohol use: Yes   Drug use: Not Currently   Sexual activity: Yes    Birth control/protection: Injection  Other Topics Concern   Not on file  Social History Narrative   Not on file   Social Determinants of Health   Financial Resource Strain: Not on file  Food Insecurity: No Food Insecurity (10/09/2022)   Hunger Vital Sign    Worried About Running Out of Food in the Last Year: Never true     Ran Out of Food in the Last Year: Never true  Transportation Needs: No Transportation Needs (10/09/2022)   PRAPARE - Administrator, Civil Service (Medical): No    Lack of Transportation (Non-Medical): No  Physical Activity: Not on file  Stress: Not on file  Social Connections: Not on file   Outpatient Medications Prior to Visit  Medication Sig   medroxyPROGESTERone Acetate 150 MG/ML SUSY Inject 1 mL (150 mg total) into the muscle every 3 (three) months.   valACYclovir (VALTREX) 500 MG tablet Take 1 tablet (500 mg total) by mouth daily. Can increase to twice a day for 5 days in the event of a recurrence.   No facility-administered medications prior to visit.   Allergies  Allergen Reactions   Kiwi Extract Other (See Comments)    Numbness and tingling in mouth when eating kiwi    Immunization History  Administered Date(s) Administered   Tdap 11/30/2020, 02/18/2021, 08/03/2022    Health Maintenance  Topic Date Due   INFLUENZA VACCINE  Never done   COVID-19 Vaccine (1 - 2023-24 season) Never done   Cervical Cancer Screening (Pap smear)  05/11/2025   DTaP/Tdap/Td (4 -  Td or Tdap) 08/03/2032   Hepatitis C Screening  Completed   HIV Screening  Completed   HPV VACCINES  Aged Out    Patient Care Team: Pcp, No as PCP - General Adrian Blackwater Rhona Raider, DO as PCP - OBGYN (Family Medicine)  Review of Systems  {Insert previous labs (optional):23779} {See past labs  Heme  Chem  Endocrine  Serology  Results Review (optional):1}   Objective    LMP 04/17/2023 (Approximate)  {Insert last BP/Wt (optional):23777}{See vitals history (optional):1}   Physical Exam ***  Depression Screen    10/05/2022   11:18 AM 08/03/2022    8:35 AM 05/11/2022    9:01 AM 12/14/2020   10:16 AM  PHQ 2/9 Scores  PHQ - 2 Score 1 3 1 4   PHQ- 9 Score 4 7 4 12    No results found for any visits on 05/15/23.  Assessment & Plan     There are no diagnoses linked to this encounter.   No  follow-ups on file.      Alfredia Ferguson, PA-C  [facility] Primary Care at [facility] [phone] (phone) [phone] (fax)  [facility]

## 2023-05-15 ENCOUNTER — Ambulatory Visit (INDEPENDENT_AMBULATORY_CARE_PROVIDER_SITE_OTHER): Payer: Medicaid Other | Admitting: Physician Assistant

## 2023-05-15 ENCOUNTER — Encounter: Payer: Self-pay | Admitting: Physician Assistant

## 2023-05-15 ENCOUNTER — Other Ambulatory Visit (HOSPITAL_COMMUNITY)
Admission: RE | Admit: 2023-05-15 | Discharge: 2023-05-15 | Disposition: A | Discharge status: Home / Self Care | Payer: Medicaid Other | Source: Ambulatory Visit | Attending: Physician Assistant | Admitting: Physician Assistant

## 2023-05-15 VITALS — BP 129/86 | HR 103 | Temp 98.4°F | Ht 64.0 in | Wt 113.4 lb

## 2023-05-15 DIAGNOSIS — Z1159 Encounter for screening for other viral diseases: Secondary | ICD-10-CM

## 2023-05-15 DIAGNOSIS — K458 Other specified abdominal hernia without obstruction or gangrene: Secondary | ICD-10-CM | POA: Diagnosis not present

## 2023-05-15 DIAGNOSIS — D649 Anemia, unspecified: Secondary | ICD-10-CM | POA: Diagnosis not present

## 2023-05-15 DIAGNOSIS — N939 Abnormal uterine and vaginal bleeding, unspecified: Secondary | ICD-10-CM | POA: Insufficient documentation

## 2023-05-15 DIAGNOSIS — Z8269 Family history of other diseases of the musculoskeletal system and connective tissue: Secondary | ICD-10-CM

## 2023-05-15 DIAGNOSIS — Z23 Encounter for immunization: Secondary | ICD-10-CM | POA: Diagnosis not present

## 2023-05-15 DIAGNOSIS — F419 Anxiety disorder, unspecified: Secondary | ICD-10-CM | POA: Insufficient documentation

## 2023-05-15 DIAGNOSIS — Z789 Other specified health status: Secondary | ICD-10-CM | POA: Diagnosis not present

## 2023-05-15 DIAGNOSIS — Z114 Encounter for screening for human immunodeficiency virus [HIV]: Secondary | ICD-10-CM | POA: Diagnosis not present

## 2023-05-15 DIAGNOSIS — F3181 Bipolar II disorder: Secondary | ICD-10-CM | POA: Insufficient documentation

## 2023-05-15 DIAGNOSIS — F319 Bipolar disorder, unspecified: Secondary | ICD-10-CM | POA: Insufficient documentation

## 2023-05-15 LAB — CBC WITH DIFFERENTIAL/PLATELET
Basophils Absolute: 0 10*3/uL (ref 0.0–0.1)
Basophils Relative: 0.5 % (ref 0.0–3.0)
Eosinophils Absolute: 0 10*3/uL (ref 0.0–0.7)
Eosinophils Relative: 0.4 % (ref 0.0–5.0)
HCT: 44.6 % (ref 36.0–46.0)
Hemoglobin: 14.5 g/dL (ref 12.0–15.0)
Lymphocytes Relative: 10.5 % — ABNORMAL LOW (ref 12.0–46.0)
Lymphs Abs: 0.7 10*3/uL (ref 0.7–4.0)
MCHC: 32.6 g/dL (ref 30.0–36.0)
MCV: 88.2 fL (ref 78.0–100.0)
Monocytes Absolute: 0.4 10*3/uL (ref 0.1–1.0)
Monocytes Relative: 5.4 % (ref 3.0–12.0)
Neutro Abs: 5.8 10*3/uL (ref 1.4–7.7)
Neutrophils Relative %: 83.2 % — ABNORMAL HIGH (ref 43.0–77.0)
Platelets: 186 10*3/uL (ref 150.0–400.0)
RBC: 5.06 Mil/uL (ref 3.87–5.11)
RDW: 13.4 % (ref 11.5–15.5)
WBC: 7 10*3/uL (ref 4.0–10.5)

## 2023-05-15 LAB — COMPREHENSIVE METABOLIC PANEL
ALT: 30 U/L (ref 0–35)
AST: 58 U/L — ABNORMAL HIGH (ref 0–37)
Albumin: 4.8 g/dL (ref 3.5–5.2)
Alkaline Phosphatase: 83 U/L (ref 39–117)
BUN: 12 mg/dL (ref 6–23)
CO2: 23 meq/L (ref 19–32)
Calcium: 9.2 mg/dL (ref 8.4–10.5)
Chloride: 102 meq/L (ref 96–112)
Creatinine, Ser: 0.82 mg/dL (ref 0.40–1.20)
GFR: 97.37 mL/min (ref >60.00)
Glucose, Bld: 70 mg/dL (ref 70–99)
Potassium: 3.4 meq/L — ABNORMAL LOW (ref 3.5–5.1)
Sodium: 139 meq/L (ref 135–145)
Total Bilirubin: 0.8 mg/dL (ref 0.2–1.2)
Total Protein: 7.8 g/dL (ref 6.0–8.3)

## 2023-05-15 NOTE — Assessment & Plan Note (Signed)
-   Referral to Psychiatry for further management.   - Verify current medication dosages and ensure continuity of prescriptions until Psychiatry appointment.

## 2023-05-15 NOTE — Assessment & Plan Note (Deleted)
Patient reports multiple hernias over the past two years, which are reducible and cause pain when she protrudes. No signs of incarceration or strangulation.  Nothing palpable on exam - Monitor for changes in frequency or pain associated with hernias.   - Consider imaging and surgical consultation if symptoms worsen or become more frequent.

## 2023-05-15 NOTE — Assessment & Plan Note (Signed)
Patient reports multiple hernias over the past two years, which are reducible and cause pain when she protrudes. No signs of incarceration or strangulation.  Nothing palpable on exam - Monitor for changes in frequency or pain associated with hernias.   - Consider imaging and surgical consultation if symptoms worsen or become more frequent.

## 2023-05-15 NOTE — Assessment & Plan Note (Signed)
-  r/o STI,bv/yeast. if negative would order pelvic ultrasound

## 2023-05-15 NOTE — Assessment & Plan Note (Signed)
Pt manages with zoloft unknown dose. Referring to psych

## 2023-05-16 ENCOUNTER — Other Ambulatory Visit: Payer: Self-pay | Admitting: Physician Assistant

## 2023-05-16 DIAGNOSIS — R7989 Other specified abnormal findings of blood chemistry: Secondary | ICD-10-CM

## 2023-05-16 LAB — CERVICOVAGINAL ANCILLARY ONLY
Bacterial Vaginitis (gardnerella): POSITIVE — AB
Candida Glabrata: NEGATIVE
Candida Vaginitis: NEGATIVE
Chlamydia: NEGATIVE
Comment: NEGATIVE
Comment: NEGATIVE
Comment: NEGATIVE
Comment: NEGATIVE
Comment: NEGATIVE
Comment: NORMAL
Neisseria Gonorrhea: NEGATIVE
Trichomonas: NEGATIVE

## 2023-05-17 ENCOUNTER — Other Ambulatory Visit (HOSPITAL_BASED_OUTPATIENT_CLINIC_OR_DEPARTMENT_OTHER): Payer: Self-pay

## 2023-05-17 ENCOUNTER — Other Ambulatory Visit: Payer: Self-pay | Admitting: Physician Assistant

## 2023-05-17 DIAGNOSIS — B9689 Other specified bacterial agents as the cause of diseases classified elsewhere: Secondary | ICD-10-CM

## 2023-05-17 MED ORDER — METRONIDAZOLE 500 MG PO TABS
500.0000 mg | ORAL_TABLET | Freq: Two times a day (BID) | ORAL | 0 refills | Status: AC
  Filled 2023-05-17 – 2023-06-07 (×2): qty 7, 4d supply, fill #0

## 2023-05-18 DIAGNOSIS — Z419 Encounter for procedure for purposes other than remedying health state, unspecified: Secondary | ICD-10-CM | POA: Diagnosis not present

## 2023-05-18 LAB — ANA: Anti Nuclear Antibody (ANA): POSITIVE — AB

## 2023-05-18 LAB — HIV ANTIBODY (ROUTINE TESTING W REFLEX): HIV 1&2 Ab, 4th Generation: NONREACTIVE

## 2023-05-18 LAB — HEPATITIS C ANTIBODY: Hepatitis C Ab: NONREACTIVE

## 2023-05-18 LAB — ANTI-NUCLEAR AB-TITER (ANA TITER): ANA Titer 1: 1:40 {titer} — ABNORMAL HIGH

## 2023-05-21 ENCOUNTER — Encounter: Payer: Self-pay | Admitting: Physician Assistant

## 2023-05-21 ENCOUNTER — Other Ambulatory Visit: Payer: Self-pay | Admitting: Physician Assistant

## 2023-05-21 DIAGNOSIS — Z840 Family history of diseases of the skin and subcutaneous tissue: Secondary | ICD-10-CM

## 2023-05-21 DIAGNOSIS — R768 Other specified abnormal immunological findings in serum: Secondary | ICD-10-CM

## 2023-05-25 ENCOUNTER — Ambulatory Visit: Payer: Medicaid Other | Admitting: Physician Assistant

## 2023-05-25 NOTE — Progress Notes (Deleted)
      Established patient visit   Patient: Elizabeth [name]   DOB: 1996-07-08   27 y.o. Female  MRN: [id number] Visit Date: 05/25/2023  Today's healthcare provider: Alfredia Ferguson, PA-C   No chief complaint on file.  Subjective     ***  Medications: Outpatient Medications Prior to Visit  Medication Sig   medroxyPROGESTERone Acetate 150 MG/ML SUSY Inject 1 mL (150 mg total) into the muscle every 3 (three) months.   OXcarbazepine (TRILEPTAL PO) Take by mouth.   Sertraline HCl (ZOLOFT PO) Take by mouth.   TRAZODONE HCL PO Take by mouth.   valACYclovir (VALTREX) 500 MG tablet Take 1 tablet (500 mg total) by mouth daily. Can increase to twice a day for 5 days in the event of a recurrence.   No facility-administered medications prior to visit.    Review of Systems {Insert previous labs (optional):23779} {See past labs  Heme  Chem  Endocrine  Serology  Results Review (optional):1}   Objective    There were no vitals taken for this visit. {Insert last BP/Wt (optional):23777}{See vitals history (optional):1}  Physical Exam  ***  No results found for any visits on 05/25/23.  Assessment & Plan    There are no diagnoses linked to this encounter.  ***  No follow-ups on file.       Alfredia Ferguson, PA-C  [facility] Primary Care at [facility] [phone] (phone) [phone] (fax)  [facility]

## 2023-05-29 ENCOUNTER — Other Ambulatory Visit (HOSPITAL_BASED_OUTPATIENT_CLINIC_OR_DEPARTMENT_OTHER): Payer: Self-pay

## 2023-06-07 ENCOUNTER — Other Ambulatory Visit (HOSPITAL_BASED_OUTPATIENT_CLINIC_OR_DEPARTMENT_OTHER): Payer: Self-pay

## 2023-06-17 DIAGNOSIS — Z419 Encounter for procedure for purposes other than remedying health state, unspecified: Secondary | ICD-10-CM | POA: Diagnosis not present

## 2023-06-18 ENCOUNTER — Ambulatory Visit: Payer: Medicaid Other

## 2023-07-18 DIAGNOSIS — Z419 Encounter for procedure for purposes other than remedying health state, unspecified: Secondary | ICD-10-CM | POA: Diagnosis not present

## 2023-07-19 ENCOUNTER — Ambulatory Visit (HOSPITAL_COMMUNITY): Payer: Medicaid Other | Admitting: Licensed Clinical Social Worker

## 2023-07-19 ENCOUNTER — Telehealth (HOSPITAL_COMMUNITY): Payer: Self-pay | Admitting: Licensed Clinical Social Worker

## 2023-07-19 ENCOUNTER — Encounter (HOSPITAL_COMMUNITY): Payer: Self-pay

## 2023-07-19 NOTE — Telephone Encounter (Signed)
 LCSW sent to links to patient's phone with no response for her 9 AM appointments.  LCSW followed up with a phone call and patient answered stating that today is not a good day to do the appointment and she wanted to reschedule.  Patient today will be marked as left without being seen and was provided phone number for rescheduling.

## 2023-07-20 ENCOUNTER — Encounter (HOSPITAL_COMMUNITY): Payer: Self-pay
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
## 2023-07-23 ENCOUNTER — Encounter (HOSPITAL_BASED_OUTPATIENT_CLINIC_OR_DEPARTMENT_OTHER): Payer: Self-pay | Admitting: Pharmacist

## 2023-07-23 ENCOUNTER — Ambulatory Visit (HOSPITAL_COMMUNITY): Payer: Medicaid Other | Admitting: Psychiatry

## 2023-07-23 ENCOUNTER — Encounter (HOSPITAL_COMMUNITY): Payer: Self-pay | Admitting: Psychiatry

## 2023-07-23 ENCOUNTER — Other Ambulatory Visit (HOSPITAL_BASED_OUTPATIENT_CLINIC_OR_DEPARTMENT_OTHER): Payer: Self-pay

## 2023-07-23 DIAGNOSIS — F3181 Bipolar II disorder: Secondary | ICD-10-CM | POA: Diagnosis not present

## 2023-07-23 DIAGNOSIS — F419 Anxiety disorder, unspecified: Secondary | ICD-10-CM

## 2023-07-23 DIAGNOSIS — F4310 Post-traumatic stress disorder, unspecified: Secondary | ICD-10-CM | POA: Insufficient documentation

## 2023-07-23 MED ORDER — PRAZOSIN HCL 1 MG PO CAPS
1.0000 mg | ORAL_CAPSULE | Freq: Every day | ORAL | 1 refills | Status: DC
  Filled 2023-07-23 – 2023-08-10 (×2): qty 30, 30d supply, fill #0

## 2023-07-23 MED ORDER — SERTRALINE HCL 50 MG PO TABS
50.0000 mg | ORAL_TABLET | Freq: Every day | ORAL | 1 refills | Status: AC
  Filled 2023-07-23 – 2023-08-10 (×2): qty 30, 30d supply, fill #0

## 2023-07-23 MED ORDER — OXCARBAZEPINE 150 MG PO TABS
150.0000 mg | ORAL_TABLET | Freq: Every day | ORAL | 1 refills | Status: DC
  Filled 2023-07-23 – 2023-08-10 (×2): qty 30, 30d supply, fill #0

## 2023-07-23 MED ORDER — TRAZODONE HCL 50 MG PO TABS
50.0000 mg | ORAL_TABLET | Freq: Every evening | ORAL | 1 refills | Status: AC | PRN
  Filled 2023-07-23 – 2023-08-10 (×2): qty 30, 30d supply, fill #0

## 2023-07-23 NOTE — Patient Instructions (Signed)
 Thank you for attending your appointment today.  -- START Prazosin  1 mg nightly -- Continue other medications as prescribed.  Please do not make any changes to medications without first discussing with your provider. If you are experiencing a psychiatric emergency, please call 911 or present to your nearest emergency department. Additional crisis, medication management, and therapy resources are included below.  [facility]  [address] [place], [place] [address] [phone] WALK-IN URGENT CARE 24/7 FOR ANYONE [address] [place], [place]  [phone] Fax: [phone] [email or web address] *Interpreters available *Accepts all insurance and uninsured for Urgent Care needs *Accepts Medicaid and uninsured for outpatient treatment (below)      ONLY FOR [facility]  Below:    Outpatient New Patient Assessment/Therapy Walk-ins:        Monday, Wednesday, and Thursday 8am until slots are full (first come, first served)                   New Patient Psychiatry/Medication Management        Monday-Friday 8am-11am (first come, first served)               For all walk-ins we ask that you arrive by 7:15am, because patients will be seen in the order of arrival.

## 2023-08-06 ENCOUNTER — Other Ambulatory Visit (HOSPITAL_BASED_OUTPATIENT_CLINIC_OR_DEPARTMENT_OTHER): Payer: Self-pay

## 2023-08-09 ENCOUNTER — Ambulatory Visit (HOSPITAL_COMMUNITY): Payer: Self-pay | Admitting: Licensed Clinical Social Worker

## 2023-08-10 ENCOUNTER — Other Ambulatory Visit (HOSPITAL_BASED_OUTPATIENT_CLINIC_OR_DEPARTMENT_OTHER): Payer: Self-pay

## 2023-08-16 ENCOUNTER — Encounter (HOSPITAL_BASED_OUTPATIENT_CLINIC_OR_DEPARTMENT_OTHER): Payer: Self-pay

## 2023-08-16 ENCOUNTER — Emergency Department (HOSPITAL_BASED_OUTPATIENT_CLINIC_OR_DEPARTMENT_OTHER)
Admission: EM | Admit: 2023-08-16 | Discharge: 2023-08-16 | Disposition: A | Discharge status: Home / Self Care | Payer: Medicaid Other | Attending: Emergency Medicine | Admitting: Emergency Medicine

## 2023-08-16 ENCOUNTER — Other Ambulatory Visit: Payer: Self-pay

## 2023-08-16 ENCOUNTER — Ambulatory Visit: Payer: Self-pay | Admitting: Physician Assistant

## 2023-08-16 DIAGNOSIS — N939 Abnormal uterine and vaginal bleeding, unspecified: Secondary | ICD-10-CM

## 2023-08-16 LAB — CBC
HCT: 37.7 % (ref 36.0–46.0)
Hemoglobin: 12.7 g/dL (ref 12.0–15.0)
MCH: 30.1 pg (ref 26.0–34.0)
MCHC: 33.7 g/dL (ref 30.0–36.0)
MCV: 89.3 fL (ref 80.0–100.0)
Platelets: 146 10*3/uL — ABNORMAL LOW (ref 150–400)
RBC: 4.22 MIL/uL (ref 3.87–5.11)
RDW: 12.4 % (ref 11.5–15.5)
WBC: 4.4 10*3/uL (ref 4.0–10.5)
nRBC: 0 % (ref 0.0–0.2)

## 2023-08-16 LAB — BASIC METABOLIC PANEL
Anion gap: 12 (ref 5–15)
BUN: 10 mg/dL (ref 6–20)
CO2: 18 mmol/L — ABNORMAL LOW (ref 22–32)
Calcium: 9 mg/dL (ref 8.9–10.3)
Chloride: 108 mmol/L (ref 98–111)
Creatinine, Ser: 0.69 mg/dL (ref 0.44–1.00)
GFR, Estimated: >60 mL/min (ref >60)
Glucose, Bld: 97 mg/dL (ref 70–99)
Potassium: 3.7 mmol/L (ref 3.5–5.1)
Sodium: 138 mmol/L (ref 135–145)

## 2023-08-16 LAB — PREGNANCY, URINE: Preg Test, Ur: NEGATIVE

## 2023-08-16 LAB — HCG, QUANTITATIVE, PREGNANCY: hCG, Beta Chain, Quant, S: <1 m[IU]/mL (ref <5)

## 2023-08-16 NOTE — ED Provider Notes (Signed)
[facility] Provider Note   CSN: [id number] Arrival date & time: 08/16/23  1516     History  Chief Complaint  Patient presents with   Vaginal Bleeding    Elizabeth [name] is a 28 y.o. female.   Vaginal Bleeding  This patient is a 28 year old female, she states that she has some mental health disease for which she takes Zoloft and Trileptal, she presents to the hospital with vaginal bleeding which has been going on for several months.  She reports that she had a Depo shot in September about a month after delivering a healthy baby.  She did not follow-up for a second Depo shot and unfortunately has been bleeding fairly frequently and heavily for the last couple of months.  She denies any significant abdominal pain vomiting lightheadedness dizziness shortness of breath fevers or chills.  She has not seen her gynecologist, she missed a follow-up appointment and she did not get a second Depo shot in December    Home Medications Prior to Admission medications   Medication Sig Start Date End Date Taking? Authorizing Provider  medroxyPROGESTERone Acetate 150 MG/ML SUSY Inject 1 mL (150 mg total) into the muscle every 3 (three) months. Patient not taking: Reported on 07/23/2023 03/26/23   Milas Hock, MD  OXcarbazepine (TRILEPTAL) 150 MG tablet Take 1 tablet (150 mg total) by mouth daily. 07/23/23 09/21/23  Bahraini, Sarah A  prazosin (MINIPRESS) 1 MG capsule Take 1 capsule (1 mg total) by mouth at bedtime. 07/23/23 09/21/23  B[place]i, Sarah A  sertraline (ZOLOFT) 50 MG tablet Take 1 tablet (50 mg total) by mouth daily. 07/23/23 09/21/23  B[place]i, Sarah A  traZODone (DESYREL) 50 MG tablet Take 1 tablet (50 mg total) by mouth at bedtime as needed for sleep. 07/23/23 09/21/23  B[place]i, Sarah A  valACYclovir (VALTREX) 500 MG tablet Take 1 tablet (500 mg total) by mouth daily. Can increase to twice a day for 5 days in the event of a recurrence. 04/17/23   Levie Heritage, DO      Allergies    Kiwi extract    Review of Systems   Review of Systems  Genitourinary:  Positive for vaginal bleeding.  All other systems reviewed and are negative.   Physical Exam Updated Vital Signs BP (!) 127/98 (BP Location: Left Arm)   Pulse 86   Temp 98.5 F (36.9 C) (Oral)   Resp 18   Ht 1.626 m (5\' 4" )   Wt 49.4 kg   SpO2 100%   BMI 18.71 kg/m  Physical Exam Vitals and nursing note reviewed.  Constitutional:      General: She is not in acute distress.    Appearance: She is well-developed.  HENT:     Head: Normocephalic and atraumatic.     Mouth/Throat:     Pharynx: No oropharyngeal exudate.  Eyes:     General: No scleral icterus.       Right eye: No discharge.        Left eye: No discharge.     Conjunctiva/sclera: Conjunctivae normal.     Pupils: Pupils are equal, round, and reactive to light.  Neck:     Thyroid: No thyromegaly.     Vascular: No JVD.  Cardiovascular:     Rate and Rhythm: Normal rate and regular rhythm.     Heart sounds: Normal heart sounds. No murmur heard.    No friction rub. No gallop.  Pulmonary:     Effort:  Pulmonary effort is normal. No respiratory distress.     Breath sounds: Normal breath sounds. No wheezing or rales.  Abdominal:     General: Bowel sounds are normal. There is no distension.     Palpations: Abdomen is soft. There is no mass.     Tenderness: There is no abdominal tenderness.  Musculoskeletal:        General: No tenderness. Normal range of motion.     Cervical back: Normal range of motion and neck supple.     Right lower leg: No edema.     Left lower leg: No edema.  Lymphadenopathy:     Cervical: No cervical adenopathy.  Skin:    General: Skin is warm and dry.     Findings: No erythema or rash.  Neurological:     Mental Status: She is alert.     Coordination: Coordination normal.  Psychiatric:        Behavior: Behavior normal.     ED Results / Procedures / Treatments   Labs (all labs  ordered are listed, but only abnormal results are displayed) Labs Reviewed  CBC - Abnormal; Notable for the following components:      Result Value   Platelets 146 (*)    All other components within normal limits  PREGNANCY, URINE  HCG, QUANTITATIVE, PREGNANCY  BASIC METABOLIC PANEL    EKG None  Radiology No results found.  Procedures Procedures    Medications Ordered in ED Medications - No data to display  ED Course/ Medical Decision Making/ A&P                                 Medical Decision Making Amount and/or Complexity of Data Reviewed Labs: ordered.   I do not think the patient needs a pelvic exam, she is not pregnant, her hemoglobin is unremarkable, her vital signs are unremarkable without hypotension or tachycardia.  I discussed the treatment options with the patient including starting her on oral birth control pills having her follow-up for a second Depo shot or doing nothing in letting her body correct, she has chosen the latter and would like to just stop taking the medication for birth control.  She will follow-up in the outpatient setting and is reassured by her normal hemoglobin level.        Final Clinical Impression(s) / ED Diagnoses Final diagnoses:  Vaginal bleeding    Rx / DC Orders ED Discharge Orders     None         Eber Hong, MD 08/16/23 1819

## 2023-08-16 NOTE — Telephone Encounter (Signed)
Copied from CRM [id number]. Topic: Clinical - Red Word Triage >> Aug 16, 2023  9:18 AM Adele Barthel wrote: Red Word that prompted transfer to Nurse Triage: Menstrual bleeding since November. Pain level at 8 out of 10. Intermittent cramping and last night bleeding was extremely heavy.  Chief Complaint: Vaginal bleeding Symptoms: Bleeding and cramping Frequency: Since October, severe since last night Pertinent Negatives: Patient denies relief Disposition: [x] ED /[] Urgent Care (no appt availability in office) / [] Appointment(In office/virtual)/ []  [facility]/ [] Home Care/ [] Refused Recommended Disposition /[] [facility]/ []  Follow-up with PCP Additional Notes: Patient called in to report menstrual bleeding that has been ongoing since October. Patient stated that the bleeding has been severe since last night. Patient stated that she has been soaking through 2 tampons and 2 pads every hour. Patient stated that she has also been pausing blood clots that range in size from a dime to a nickel. Patient stated that she is having abdominal pain/cramping. Patient rated the pain currently at a 5, but stated that it was an 8 this morning. Patient stated that she started the Depo shot in September and is wondering if that has anything to do with the ongoing bleeding. Patient had a baby last year and stated that it has been a long time since she had a menstrual cycle, but did state her cycles are typically "normal". Advised ED due to severity of bleeding. Patient complied and stated that she would go to the ED this afternoon when she can arrange childcare.   Reason for Disposition  SEVERE vaginal bleeding (e.g., soaking 2 pads or tampons per hour and present 2 or more hours; 1 menstrual cup every 2 hours)  Answer Assessment - Initial Assessment Questions 1. AMOUNT: "Describe the bleeding that you are having."    - SPOTTING: spotting, or pinkish / brownish mucous discharge; does not fill panty liner  or pad    - MILD:  less than 1 pad / hour; less than patient's usual menstrual bleeding   - MODERATE: 1-2 pads / hour; 1 menstrual cup every 6 hours; small-medium blood clots (e.g., pea, grape, small coin)   - SEVERE: soaking 2 or more pads/hour for 2 or more hours; 1 menstrual cup every 2 hours; bleeding not contained by pads or continuous red blood from vagina; large blood clots (e.g., golf ball, large coin)      Severe- states bleeding is heavy at this time, states soaking through a tampon and a pad twice in an hour, states heavy bleeding is on and off, but has been present since last night  2. ONSET: "When did the bleeding begin?" "Is it continuing now?"     Bleeding started at the end of October and has not stopped  3. MENSTRUAL PERIOD: "When was the last normal menstrual period?" "How is this different than your period?"     Bleeding has lasted longer  4. REGULARITY: "How regular are your periods?"     States menstrual cycles are typically normal  5. ABDOMEN PAIN: "Do you have any pain?" "How bad is the pain?"  (e.g., Scale 1-10; mild, moderate, or severe)   - MILD (1-3): doesn't interfere with normal activities, abdomen soft and not tender to touch    - MODERATE (4-7): interferes with normal activities or awakens from sleep, abdomen tender to touch    - SEVERE (8-10): excruciating pain, doubled over, unable to do any normal activities      States pain is about a 5  at this time, this morning was about an 8  6. PREGNANCY: "Is there any chance you are pregnant?" "When was your last menstrual period?"     States she highly doubts she's pregnant  7. BREASTFEEDING: "Are you breastfeeding?"     Denies  8. HORMONE MEDICINES: "Are you taking any hormone medicines, prescription or over-the-counter?" (e.g., birth control pills, estrogen)     Depo shot  9. BLOOD THINNER MEDICINES: "Do you take any blood thinners?" (e.g., Coumadin / warfarin, Pradaxa / dabigatran, aspirin)     Denies  10.  CAUSE: "What do you think is causing the bleeding?" (e.g., recent gyn surgery, recent gyn procedure; known bleeding disorder, cervical cancer, polycystic ovarian disease, fibroids)       Questioning if the depo shot has anything to do with it, states first dose was in September  11. HEMODYNAMIC STATUS: "Are you weak or feeling lightheaded?" If Yes, ask: "Can you stand and walk normally?"      States she feels week at times  12. OTHER SYMPTOMS: "What other symptoms are you having with the bleeding?" (e.g., passed tissue, vaginal discharge, fever, menstrual-type cramps)       Blot clots ranging from the size of a dime to a nickel  Protocols used: Vaginal Bleeding - Abnormal-A-AH

## 2023-08-16 NOTE — Discharge Instructions (Signed)
Your testing was reassuring, you are not significantly anemic, you can follow-up with your family doctor and they can readjust medications or start birth control to help regulate your cycle.  I would expect you to have ongoing bleeding until you follow-up with them

## 2023-08-16 NOTE — ED Triage Notes (Signed)
Patient arrives with complaints persistent vaginal bleeding x3 months. Patient states that the bleeding has worsened and she is now passing clots as well. Rates her abdominal discomfort a 7/10.

## 2023-08-18 DIAGNOSIS — Z419 Encounter for procedure for purposes other than remedying health state, unspecified: Secondary | ICD-10-CM | POA: Diagnosis not present

## 2023-08-21 ENCOUNTER — Encounter: Payer: Self-pay | Admitting: Physician Assistant

## 2023-08-21 ENCOUNTER — Ambulatory Visit (INDEPENDENT_AMBULATORY_CARE_PROVIDER_SITE_OTHER): Payer: Medicaid Other | Admitting: Physician Assistant

## 2023-08-21 ENCOUNTER — Other Ambulatory Visit (HOSPITAL_COMMUNITY)
Admission: RE | Admit: 2023-08-21 | Discharge: 2023-08-21 | Disposition: A | Discharge status: Home / Self Care | Payer: Medicaid Other | Source: Ambulatory Visit | Attending: Physician Assistant | Admitting: Physician Assistant

## 2023-08-21 VITALS — BP 123/86 | HR 79 | Temp 98.7°F | Ht 64.0 in | Wt 111.2 lb

## 2023-08-21 DIAGNOSIS — N898 Other specified noninflammatory disorders of vagina: Secondary | ICD-10-CM

## 2023-08-21 DIAGNOSIS — N939 Abnormal uterine and vaginal bleeding, unspecified: Secondary | ICD-10-CM | POA: Diagnosis not present

## 2023-08-21 DIAGNOSIS — R102 Pelvic and perineal pain: Secondary | ICD-10-CM

## 2023-08-21 LAB — POC URINALSYSI DIPSTICK (AUTOMATED)
Glucose, UA: NEGATIVE
Ketones, UA: 15
Nitrite, UA: NEGATIVE
Protein, UA: POSITIVE — AB
Spec Grav, UA: 1.025 (ref 1.010–1.025)
Urobilinogen, UA: 0.2 U/dL
pH, UA: 6 (ref 5.0–8.0)

## 2023-08-21 NOTE — Progress Notes (Signed)
 Established patient visit   Patient: Elizabeth [name]   DOB: Feb 12, 1996   28 y.o. Female  MRN: [id number] Visit Date: 08/21/2023  Today's healthcare provider: Manuelita Flatness, PA-C   Chief Complaint  Patient presents with   Follow-up    Painful cramps, no energy. Was seen in ED 1/30. States she would like a pap- having some odd discharge. Has been very nauseated with bleeding. She had a few big clots come How long- at least month Pain scale- usually 5-6    Subjective     Pt was seen in the ED 08/16/23 for vaginal bleeding, pt referred back to PCP.  She reports she has been bleeding nearly daily since October 2024, last Depo injection was 03/2023. Reports lower abdominal cramping.  She reports last two days, itching, vaginal discharge, yellow, some lower abdominal pressure. Denies dyspareunia.  Medications: Outpatient Medications Prior to Visit  Medication Sig   medroxyPROGESTERone  Acetate 150 MG/ML SUSY Inject 1 mL (150 mg total) into the muscle every 3 (three) months.   OXcarbazepine  (TRILEPTAL ) 150 MG tablet Take 1 tablet (150 mg total) by mouth daily.   prazosin  (MINIPRESS ) 1 MG capsule Take 1 capsule (1 mg total) by mouth at bedtime.   sertraline  (ZOLOFT ) 50 MG tablet Take 1 tablet (50 mg total) by mouth daily.   traZODone  (DESYREL ) 50 MG tablet Take 1 tablet (50 mg total) by mouth at bedtime as needed for sleep.   valACYclovir  (VALTREX ) 500 MG tablet Take 1 tablet (500 mg total) by mouth daily. Can increase to twice a day for 5 days in the event of a recurrence.   No facility-administered medications prior to visit.    Review of Systems  Constitutional:  Negative for fatigue and fever.  Respiratory:  Negative for cough and shortness of breath.   Cardiovascular:  Negative for chest pain and leg swelling.  Gastrointestinal:  Positive for abdominal pain.  Genitourinary:  Positive for vaginal bleeding and vaginal discharge.  Neurological:  Negative for dizziness and  headaches.       Objective    BP 123/86   Pulse 79   Temp 98.7 F (37.1 C) (Oral)   Ht 5' 4 (1.626 m)   Wt 111 lb 4 oz (50.5 kg)   SpO2 97%   BMI 19.10 kg/m    Physical Exam Vitals reviewed.  Constitutional:      Appearance: She is not ill-appearing.  HENT:     Head: Normocephalic.  Eyes:     Conjunctiva/sclera: Conjunctivae normal.  Cardiovascular:     Rate and Rhythm: Normal rate.  Pulmonary:     Effort: Pulmonary effort is normal. No respiratory distress.  Abdominal:     Tenderness: There is abdominal tenderness in the right upper quadrant.  Neurological:     General: No focal deficit present.     Mental Status: She is alert and oriented to person, place, and time.  Psychiatric:        Mood and Affect: Mood normal.        Behavior: Behavior normal.     Results for orders placed or performed in visit on 08/21/23  POCT Urinalysis Dipstick (Automated)  Result Value Ref Range   Color, UA Orange    Clarity, UA Cloudy    Glucose, UA Negative Negative   Bilirubin, UA Moderate    Ketones, UA 15 mg    Spec Grav, UA 1.025 1.010 - 1.025   Blood, UA Small    pH, UA  6.0 5.0 - 8.0   Protein, UA Positive (A) Negative   Urobilinogen, UA 0.2 0.2 or 1.0 E.U./dL   Nitrite, UA Negative    Leukocytes, UA Trace (A) Negative    Assessment & Plan    Vaginal bleeding -     CBC with Differential/Platelet; Future -     Protime-INR; Future -     APTT; Future -     Iron, TIBC and Ferritin Panel; Future -     US  PELVIC COMPLETE WITH TRANSVAGINAL  Vaginal discharge -     Cervicovaginal ancillary only -     POCT Urinalysis Dipstick (Automated)  Suprapubic pain -     Urine Culture   Recommending pelvic ultrasound UA today trace leuks, blood, protein, sending for culture Self-swab performed, r/o bv,yeast,g/c.   Advised in 2 weeks to repeat cbc, pt/inr/iron panel  Return if symptoms worsen or fail to improve.       Manuelita Flatness, PA-C  [facility] Primary  Care at [facility] [phone] (phone) [phone] (fax)  [facility]

## 2023-08-22 ENCOUNTER — Other Ambulatory Visit (HOSPITAL_BASED_OUTPATIENT_CLINIC_OR_DEPARTMENT_OTHER): Payer: Self-pay

## 2023-08-22 LAB — URINE CULTURE
MICRO NUMBER:: 16039129
Result:: NO GROWTH
SPECIMEN QUALITY:: ADEQUATE

## 2023-08-23 ENCOUNTER — Encounter: Payer: Self-pay | Admitting: Physician Assistant

## 2023-08-23 ENCOUNTER — Other Ambulatory Visit: Payer: Self-pay | Admitting: Physician Assistant

## 2023-08-23 ENCOUNTER — Other Ambulatory Visit (HOSPITAL_BASED_OUTPATIENT_CLINIC_OR_DEPARTMENT_OTHER): Payer: Self-pay

## 2023-08-23 DIAGNOSIS — B3731 Acute candidiasis of vulva and vagina: Secondary | ICD-10-CM

## 2023-08-23 DIAGNOSIS — N760 Acute vaginitis: Secondary | ICD-10-CM

## 2023-08-23 LAB — CERVICOVAGINAL ANCILLARY ONLY
Bacterial Vaginitis (gardnerella): POSITIVE — AB
Candida Glabrata: NEGATIVE
Candida Vaginitis: POSITIVE — AB
Chlamydia: NEGATIVE
Comment: NEGATIVE
Comment: NEGATIVE
Comment: NEGATIVE
Comment: NEGATIVE
Comment: NEGATIVE
Comment: NORMAL
Neisseria Gonorrhea: NEGATIVE
Trichomonas: NEGATIVE

## 2023-08-23 MED ORDER — FLUCONAZOLE 150 MG PO TABS
150.0000 mg | ORAL_TABLET | Freq: Once | ORAL | 0 refills | Status: AC
  Filled 2023-08-23: qty 1, 1d supply, fill #0

## 2023-08-23 MED ORDER — METRONIDAZOLE 500 MG PO TABS
500.0000 mg | ORAL_TABLET | Freq: Two times a day (BID) | ORAL | 0 refills | Status: AC
  Filled 2023-08-23: qty 14, 7d supply, fill #0

## 2023-08-27 ENCOUNTER — Ambulatory Visit (HOSPITAL_BASED_OUTPATIENT_CLINIC_OR_DEPARTMENT_OTHER)
Admission: RE | Admit: 2023-08-27 | Discharge: 2023-08-27 | Disposition: A | Discharge status: Home / Self Care | Payer: Medicaid Other | Source: Ambulatory Visit | Attending: Physician Assistant | Admitting: Physician Assistant

## 2023-08-27 DIAGNOSIS — N939 Abnormal uterine and vaginal bleeding, unspecified: Secondary | ICD-10-CM | POA: Insufficient documentation

## 2023-08-27 DIAGNOSIS — N854 Malposition of uterus: Secondary | ICD-10-CM | POA: Diagnosis not present

## 2023-08-27 DIAGNOSIS — N83292 Other ovarian cyst, left side: Secondary | ICD-10-CM | POA: Diagnosis not present

## 2023-08-28 ENCOUNTER — Encounter: Payer: Self-pay | Admitting: Physician Assistant

## 2023-09-04 ENCOUNTER — Other Ambulatory Visit: Payer: Medicaid Other

## 2023-09-04 ENCOUNTER — Other Ambulatory Visit (INDEPENDENT_AMBULATORY_CARE_PROVIDER_SITE_OTHER): Payer: Medicaid Other

## 2023-09-04 ENCOUNTER — Other Ambulatory Visit (HOSPITAL_BASED_OUTPATIENT_CLINIC_OR_DEPARTMENT_OTHER): Payer: Self-pay

## 2023-09-04 DIAGNOSIS — N939 Abnormal uterine and vaginal bleeding, unspecified: Secondary | ICD-10-CM | POA: Diagnosis not present

## 2023-09-04 LAB — CBC WITH DIFFERENTIAL/PLATELET
Basophils Absolute: 0 10*3/uL (ref 0.0–0.1)
Basophils Relative: 0.4 % (ref 0.0–3.0)
Eosinophils Absolute: 0 10*3/uL (ref 0.0–0.7)
Eosinophils Relative: 0.6 % (ref 0.0–5.0)
HCT: 39.9 % (ref 36.0–46.0)
Hemoglobin: 13.2 g/dL (ref 12.0–15.0)
Lymphocytes Relative: 19.1 % (ref 12.0–46.0)
Lymphs Abs: 0.9 10*3/uL (ref 0.7–4.0)
MCHC: 33.1 g/dL (ref 30.0–36.0)
MCV: 91.9 fL (ref 78.0–100.0)
Monocytes Absolute: 0.4 10*3/uL (ref 0.1–1.0)
Monocytes Relative: 9.5 % (ref 3.0–12.0)
Neutro Abs: 3.1 10*3/uL (ref 1.4–7.7)
Neutrophils Relative %: 70.4 % (ref 43.0–77.0)
Platelets: 172 10*3/uL (ref 150.0–400.0)
RBC: 4.34 Mil/uL (ref 3.87–5.11)
RDW: 12.8 % (ref 11.5–15.5)
WBC: 4.5 10*3/uL (ref 4.0–10.5)

## 2023-09-04 LAB — IBC + FERRITIN
Ferritin: 77.5 ng/mL (ref 10.0–291.0)
Iron: 134 ug/dL (ref 42–145)
Saturation Ratios: 39.4 % (ref 20.0–50.0)
TIBC: 340.2 ug/dL (ref 250.0–450.0)
Transferrin: 243 mg/dL (ref 212.0–360.0)

## 2023-09-04 LAB — PROTIME-INR
INR: 1 {ratio} (ref 0.8–1.0)
Prothrombin Time: 10.7 s (ref 9.6–13.1)

## 2023-09-04 LAB — APTT: aPTT: 23.5 s — ABNORMAL LOW (ref 25.4–36.8)

## 2023-09-05 ENCOUNTER — Encounter: Payer: Self-pay | Admitting: Physician Assistant

## 2023-09-06 NOTE — Progress Notes (Unsigned)
Patient did not connect for virtual psychiatric medication management appointment on 09/07/23 at 11AM. Sent secure video link with no response. Left VM with callback number to reschedule.  Daine Gip, MD 09/07/23

## 2023-09-07 ENCOUNTER — Encounter (HOSPITAL_COMMUNITY): Payer: Self-pay

## 2023-09-07 ENCOUNTER — Encounter (HOSPITAL_COMMUNITY): Payer: Medicaid Other | Admitting: Psychiatry

## 2023-09-15 DIAGNOSIS — Z419 Encounter for procedure for purposes other than remedying health state, unspecified: Secondary | ICD-10-CM | POA: Diagnosis not present

## 2023-10-17 NOTE — Progress Notes (Signed)
 Office Visit Note  Patient: Elizabeth [name]             Date of Birth: 08-12-1995           MRN: [id number]             PCP: Trenton Frock, PA-C Referring: Trenton Frock, PA-C Visit Date: 10/18/2023 Occupation: @GUAROCC @  Subjective:  Follow-up (Patient states she had an abnormal ANA test. Patient states Lupus runs in her family. )   Discussed the use of AI scribe software for clinical note transcription with the patient, who gave verbal consent to proceed.  History of Present Illness   Elizabeth [name] is a 28 year old female who presents with skin problems and a positive ANA test. She was referred for evaluation due to a family history of lupus and these abnormal lab test results.  She has experienced skin problems since childhood, particularly sensitivity to the sun. She describes blotchy areas on her arms and back, and dry, itchy skin on her face. The rashes often leave behind dark spots, even without scratching. She has not used any medications or topical treatments for these skin issues and has not seen a dermatologist.  She experiences joint and muscle pain, primarily in her shoulders, arms, and sometimes fingers, which began around the age of 5 or 41. Her fingers occasionally swell, particularly noticeable at night when removing rings. She takes ibuprofen, two extra strength tablets, approximately three times a week for pain relief.  She has a history of heavy menstrual bleeding, which was exacerbated by Depo-Provera shots, leading to continuous bleeding for four to five months. Her iron levels were checked during this time and were normal. She has not taken iron supplements.  Her family history is significant for lupus, with her mother having a positive ANA test and her aunt having passed away, possibly related to lupus. Her cousin had a liver transplant, but no kidney disease is known in the family.  No history of blood clots, abnormal bleeding outside of menstrual  issues, or significant swelling of lymph nodes. She reports occasional canker sores and cold hands but no significant mouth or nasal ulcers. She does not experience discoloration of fingers or toes, and her feet do not swell.       Activities of Daily Living:  Patient reports morning stiffness for 0 minute.   Patient Reports nocturnal pain.  Difficulty dressing/grooming: Denies Difficulty climbing stairs: Denies Difficulty getting out of chair: Denies Difficulty using hands for taps, buttons, cutlery, and/or writing: Denies  Review of Systems  Constitutional:  Positive for fatigue.  HENT:  Positive for mouth dryness. Negative for mouth sores.   Eyes:  Positive for dryness.  Respiratory:  Negative for shortness of breath.   Cardiovascular:  Negative for chest pain and palpitations.  Gastrointestinal:  Negative for blood in stool, constipation and diarrhea.  Endocrine: Negative for increased urination.  Genitourinary:  Negative for involuntary urination.  Musculoskeletal:  Positive for joint pain, joint pain, myalgias, muscle tenderness and myalgias. Negative for gait problem, joint swelling, muscle weakness and morning stiffness.  Skin:  Positive for rash, hair loss and sensitivity to sunlight. Negative for color change.  Allergic/Immunologic: Positive for susceptible to infections.  Neurological:  Positive for headaches. Negative for dizziness.  Hematological:  Negative for swollen glands.  Psychiatric/Behavioral:  Positive for depressed mood and sleep disturbance. The patient is nervous/anxious.     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    pt reports last outbreak years ago   Vaginal Pap smear, abnormal     Family History  Problem Relation Age of Onset   Stroke Mother    Hypertension Mother    Lupus Mother    Bipolar disorder Mother    Alcohol abuse Father    Bipolar disorder Sister    Alcohol abuse Paternal Grandmother    Lupus Maternal Aunt    Schizophrenia Maternal Aunt    Cancer Neg Hx    Diabetes Neg Hx    Asthma Neg Hx    Heart disease Neg Hx    Past Surgical History:  Procedure Laterality Date   NO PAST SURGERIES     Social History   Social History Narrative   Not on file   Immunization History  Administered Date(s) Administered   Influenza, Seasonal, Injecte, Preservative Fre 05/15/2023   Tdap 11/30/2020, 02/18/2021, 08/03/2022     Objective: Vital Signs: BP 122/77 (BP Location: Right Arm, Patient Position: Sitting, Cuff Size: Normal)   Pulse 65   Resp 14   Ht 5' 4.25" (1.632 m)   Wt 110 lb (49.9 kg)   LMP  (LMP Unknown)   Breastfeeding No   BMI 18.73 kg/m    Physical Exam Eyes:     Conjunctiva/sclera: Conjunctivae normal.  Cardiovascular:     Rate and Rhythm: Normal rate and regular rhythm.  Pulmonary:     Effort: Pulmonary effort is normal.     Breath sounds: Normal breath sounds.  Lymphadenopathy:     Cervical: No cervical adenopathy.  Skin:    General: Skin is warm and dry.     Comments: Normal appearing nailfold capillaries No digital pitting  Neurological:     Mental Status: She is alert.  Psychiatric:        Mood and Affect: Mood normal.      Musculoskeletal Exam:  Shoulders full ROM no tenderness or swelling Elbows full ROM no tenderness or swelling Wrists full ROM no tenderness or  swelling Fingers full ROM no tenderness or swelling Knees full ROM no tenderness or swelling Ankles full ROM no tenderness or swelling   Investigation: No additional findings.  Imaging: No results found.  Recent Labs: Lab Results  Component Value Date   WBC 4.5 09/04/2023   HGB 13.2 09/04/2023   PLT 172.0 09/04/2023   NA 138 08/16/2023   K 3.7 08/16/2023   CL 108 08/16/2023   CO2 18 (L) 08/16/2023   GLUCOSE 97 08/16/2023   BUN 10 08/16/2023   CREATININE 0.69 08/16/2023   BILITOT 0.8 05/15/2023   ALKPHOS 83 05/15/2023   AST 58 (H) 05/15/2023   ALT 30 05/15/2023   PROT 7.8 05/15/2023   ALBUMIN 4.8 05/15/2023   CALCIUM 9.0 08/16/2023    Speciality Comments: No specialty comments available.  Procedures:  No procedures performed Allergies: Kiwi extract   Assessment /  Plan:     Visit Diagnoses: Positive ANA (antinuclear antibody) - Plan: Sedimentation rate, C-reactive protein, C3 and C4, RNP Antibody, Sjogrens syndrome-A extractable nuclear antibody, Sjogrens syndrome-B extractable nuclear antibody, Anti-DNA antibody, double-stranded, Protein / creatinine ratio, urine, Rheumatoid factor, Cyclic citrul peptide antibody, IgG Positive ANA at low level. Family history and demographic warrant further evaluation. No overt autoimmune signs, but further testing needed to rule out lupus, Sjogren's syndrome, sarcoidosis, and rheumatoid arthritis etc. Lower pretest suspicion so if negative would not recommend extensive monitoring. - Order additional blood tests for specific antibodies related to lupus, Sjogren's syndrome, sarcoidosis, and rheumatoid arthritis. - Screen urine for protein to assess potential kidney involvement.  Joint pain and swelling Intermittent joint pain and swelling in shoulders, arms, and fingers since age 38-19. No major injuries or significant abnormalities on examination. Ibuprofen used as needed for pain relief.  Chronic skin problems Long-standing skin  issues with blotches and dry, itchy skin, exacerbated by sun exposure. Suspected histamine-mediated inflammation, possibly eczema or atopic dermatitis. Would recommend dermatology f/u if persistent despite normal labs.  Heavy menstrual bleeding Heavy menstrual bleeding exacerbated by Depo-Provera, resolved for past two months. Iron levels normal during episodes.    Orders: Orders Placed This Encounter  Procedures   Sedimentation rate   C-reactive protein   C3 and C4   RNP Antibody   Sjogrens syndrome-A extractable nuclear antibody   Sjogrens syndrome-B extractable nuclear antibody   Anti-DNA antibody, double-stranded   Protein / creatinine ratio, urine   Rheumatoid factor   Cyclic citrul peptide antibody, IgG   No orders of the defined types were placed in this encounter.    Follow-Up Instructions: No follow-ups on file.   Matt Song, MD  Note - This record has been created using [employer].  Chart creation errors have been sought, but may not always  have been located. Such creation errors do not reflect on  the standard of medical care.

## 2023-10-18 ENCOUNTER — Ambulatory Visit: Payer: Medicaid Other | Attending: Internal Medicine | Admitting: Internal Medicine

## 2023-10-18 ENCOUNTER — Encounter: Payer: Self-pay | Admitting: Internal Medicine

## 2023-10-18 VITALS — BP 122/77 | HR 65 | Resp 14 | Ht 64.25 in | Wt 110.0 lb

## 2023-10-18 DIAGNOSIS — R768 Other specified abnormal immunological findings in serum: Secondary | ICD-10-CM

## 2023-10-18 DIAGNOSIS — R21 Rash and other nonspecific skin eruption: Secondary | ICD-10-CM

## 2023-10-18 DIAGNOSIS — M25542 Pain in joints of left hand: Secondary | ICD-10-CM

## 2023-10-18 DIAGNOSIS — M2550 Pain in unspecified joint: Secondary | ICD-10-CM | POA: Insufficient documentation

## 2023-10-18 DIAGNOSIS — M25541 Pain in joints of right hand: Secondary | ICD-10-CM

## 2023-10-20 LAB — SJOGRENS SYNDROME-B EXTRACTABLE NUCLEAR ANTIBODY: SSB (La) (ENA) Antibody, IgG: <1 AI

## 2023-10-20 LAB — SEDIMENTATION RATE: Sed Rate: 9 mm/h (ref 0–20)

## 2023-10-20 LAB — PROTEIN / CREATININE RATIO, URINE
Creatinine, Urine: 87 mg/dL (ref 20–275)
Protein/Creat Ratio: 92 mg/g{creat} (ref 24–184)
Protein/Creatinine Ratio: 0.092 mg/mg{creat} (ref 0.024–0.184)
Total Protein, Urine: 8 mg/dL (ref 5–24)

## 2023-10-20 LAB — RHEUMATOID FACTOR: Rheumatoid fact SerPl-aCnc: <10 [IU]/mL (ref <14)

## 2023-10-20 LAB — C3 AND C4
C3 Complement: 111 mg/dL (ref 83–193)
C4 Complement: 21 mg/dL (ref 15–57)

## 2023-10-20 LAB — ANTI-DNA ANTIBODY, DOUBLE-STRANDED: ds DNA Ab: 1 [IU]/mL

## 2023-10-20 LAB — CYCLIC CITRUL PEPTIDE ANTIBODY, IGG: Cyclic Citrullin Peptide Ab: <16 U

## 2023-10-20 LAB — C-REACTIVE PROTEIN: CRP: <3 mg/L (ref <8.0)

## 2023-10-20 LAB — RNP ANTIBODY: Ribonucleic Protein(ENA) Antibody, IgG: <1 AI

## 2023-10-20 LAB — SJOGRENS SYNDROME-A EXTRACTABLE NUCLEAR ANTIBODY: SSA (Ro) (ENA) Antibody, IgG: <1 AI

## 2023-10-25 ENCOUNTER — Ambulatory Visit: Admitting: Physician Assistant

## 2023-10-27 DIAGNOSIS — Z419 Encounter for procedure for purposes other than remedying health state, unspecified: Secondary | ICD-10-CM | POA: Diagnosis not present

## 2023-11-01 ENCOUNTER — Other Ambulatory Visit (HOSPITAL_COMMUNITY)
Admission: RE | Admit: 2023-11-01 | Discharge: 2023-11-01 | Disposition: A | Discharge status: Home / Self Care | Source: Ambulatory Visit | Attending: Physician Assistant | Admitting: Physician Assistant

## 2023-11-01 ENCOUNTER — Encounter: Payer: Self-pay | Admitting: Physician Assistant

## 2023-11-01 ENCOUNTER — Ambulatory Visit (INDEPENDENT_AMBULATORY_CARE_PROVIDER_SITE_OTHER): Admitting: Physician Assistant

## 2023-11-01 VITALS — BP 116/79 | HR 79 | Ht 64.25 in | Wt 103.8 lb

## 2023-11-01 DIAGNOSIS — F3181 Bipolar II disorder: Secondary | ICD-10-CM

## 2023-11-01 DIAGNOSIS — N898 Other specified noninflammatory disorders of vagina: Secondary | ICD-10-CM | POA: Diagnosis not present

## 2023-11-01 DIAGNOSIS — Z113 Encounter for screening for infections with a predominantly sexual mode of transmission: Secondary | ICD-10-CM

## 2023-11-01 NOTE — Progress Notes (Signed)
 Established patient visit   Patient: Elizabeth [name]   DOB: 1996-05-04   27 y.o. Female  MRN: [id number] Visit Date: 11/01/2023  Today's healthcare provider: Alfredia Ferguson, PA-C   Cc. STI screen  Subjective    Pt reports today because this morning during intercourse, as she was starting, she felt some pain near her vaginal opening. The pain persisted post stopping intercourse. Denies any bleeding at the time. Denies noticing anything concerning on self exam.  Pain when bearing down, sitting, reports increased discharge, but denies abnormal vaginal bleeding. Medications: Outpatient Medications Prior to Visit  Medication Sig   sertraline (ZOLOFT) 50 MG tablet Take 1 tablet (50 mg total) by mouth daily.   traZODone (DESYREL) 50 MG tablet Take 1 tablet (50 mg total) by mouth at bedtime as needed for sleep.   valACYclovir (VALTREX) 500 MG tablet Take 1 tablet (500 mg total) by mouth daily. Can increase to twice a day for 5 days in the event of a recurrence. (Patient not taking: Reported on 10/18/2023)   [DISCONTINUED] medroxyPROGESTERone Acetate 150 MG/ML SUSY Inject 1 mL (150 mg total) into the muscle every 3 (three) months. (Patient not taking: Reported on 10/18/2023)   [DISCONTINUED] OXcarbazepine (TRILEPTAL) 150 MG tablet Take 1 tablet (150 mg total) by mouth daily.   [DISCONTINUED] prazosin (MINIPRESS) 1 MG capsule Take 1 capsule (1 mg total) by mouth at bedtime.   No facility-administered medications prior to visit.    Review of Systems  Constitutional:  Negative for fatigue and fever.  Respiratory:  Negative for cough and shortness of breath.   Cardiovascular:  Negative for chest pain and leg swelling.  Gastrointestinal:  Negative for abdominal pain.  Genitourinary:  Positive for vaginal pain.  Neurological:  Negative for dizziness and headaches.       Objective    BP 116/79   Pulse 79   Ht 5' 4.25" (1.632 m)   Wt 103 lb 12.8 oz (47.1 kg)   LMP 10/25/2023 (Exact  Date)   BMI 17.68 kg/m    Physical Exam Vitals reviewed.  Constitutional:      Appearance: She is not ill-appearing.  HENT:     Head: Normocephalic.  Eyes:     Conjunctiva/sclera: Conjunctivae normal.  Cardiovascular:     Rate and Rhythm: Normal rate.  Pulmonary:     Effort: Pulmonary effort is normal. No respiratory distress.  Genitourinary:    Comments: Limited internal exam 2/2 pain  No visible lesions/sores/tears/lumps.  Pt is most tender L inferior to vaginal opening.  Unable to complete full pelvic internal exam d/t pain.  On manual exam no obvious tears/masses felt. She is tender left inferior vaginal wall Small amount of pinkish/grey discharge Neurological:     Mental Status: She is alert and oriented to person, place, and time.  Psychiatric:        Mood and Affect: Mood normal.        Behavior: Behavior normal.     No results found for any visits on 11/01/23.  Assessment & Plan    Vaginal discharge -     Cervicovaginal ancillary only  Routine screening for STI (sexually transmitted infection) -     Cervicovaginal ancillary only -     RPR -     Hepatitis C antibody -     HIV Antibody (routine testing w rflx)  Bipolar 2 disorder (HCC) -     VITAMIN D 25 Hydroxy (Vit-D Deficiency, Fractures) -  TSH + free T4   Advised warm sitz/epsom salt baths. No obvious wounds, fissures, lacerations.  Will check vaginal swab, pt ok with full STI testing today.   If symptoms persist would refer to gyn  Return if symptoms worsen or fail to improve.       Trenton Frock, PA-C  [facility] Primary Care at [facility] [address] [phone] (phone) [phone] (fax)  [facility]

## 2023-11-02 LAB — CERVICOVAGINAL ANCILLARY ONLY
Bacterial Vaginitis (gardnerella): POSITIVE — AB
Candida Glabrata: NEGATIVE
Candida Vaginitis: POSITIVE — AB
Chlamydia: NEGATIVE
Comment: NEGATIVE
Comment: NEGATIVE
Comment: NEGATIVE
Comment: NEGATIVE
Comment: NEGATIVE
Comment: NORMAL
Neisseria Gonorrhea: NEGATIVE
Trichomonas: NEGATIVE

## 2023-11-02 LAB — TSH+FREE T4
Free T4: 1.03 ng/dL (ref 0.82–1.77)
TSH: 1.8 u[IU]/mL (ref 0.450–4.500)

## 2023-11-02 LAB — VITAMIN D 25 HYDROXY (VIT D DEFICIENCY, FRACTURES): Vit D, 25-Hydroxy: 9.1 ng/mL — ABNORMAL LOW (ref 30.0–100.0)

## 2023-11-03 LAB — HEPATITIS C ANTIBODY: Hepatitis C Ab: NONREACTIVE

## 2023-11-03 LAB — RPR: RPR Ser Ql: NONREACTIVE

## 2023-11-03 LAB — HIV ANTIBODY (ROUTINE TESTING W REFLEX): HIV 1&2 Ab, 4th Generation: NONREACTIVE

## 2023-11-05 ENCOUNTER — Encounter: Payer: Self-pay | Admitting: Physician Assistant

## 2023-11-05 ENCOUNTER — Other Ambulatory Visit (HOSPITAL_BASED_OUTPATIENT_CLINIC_OR_DEPARTMENT_OTHER): Payer: Self-pay

## 2023-11-05 ENCOUNTER — Other Ambulatory Visit: Payer: Self-pay | Admitting: Physician Assistant

## 2023-11-05 DIAGNOSIS — B9689 Other specified bacterial agents as the cause of diseases classified elsewhere: Secondary | ICD-10-CM

## 2023-11-05 DIAGNOSIS — B379 Candidiasis, unspecified: Secondary | ICD-10-CM

## 2023-11-05 DIAGNOSIS — E559 Vitamin D deficiency, unspecified: Secondary | ICD-10-CM

## 2023-11-05 MED ORDER — FLUCONAZOLE 150 MG PO TABS
150.0000 mg | ORAL_TABLET | Freq: Once | ORAL | 0 refills | Status: AC
  Filled 2023-11-05: qty 2, 3d supply, fill #0

## 2023-11-05 MED ORDER — CHOLECALCIFEROL 1.25 MG (50000 UT) PO CAPS
50000.0000 [IU] | ORAL_CAPSULE | ORAL | 1 refills | Status: AC
  Filled 2023-11-05: qty 4, 28d supply, fill #0

## 2023-11-05 MED ORDER — METRONIDAZOLE 500 MG PO TABS
500.0000 mg | ORAL_TABLET | Freq: Two times a day (BID) | ORAL | 0 refills | Status: AC
  Filled 2023-11-05: qty 14, 7d supply, fill #0

## 2023-11-16 ENCOUNTER — Other Ambulatory Visit (HOSPITAL_BASED_OUTPATIENT_CLINIC_OR_DEPARTMENT_OTHER): Payer: Self-pay

## 2023-11-26 DIAGNOSIS — Z419 Encounter for procedure for purposes other than remedying health state, unspecified: Secondary | ICD-10-CM | POA: Diagnosis not present

## 2023-12-12 ENCOUNTER — Other Ambulatory Visit (HOSPITAL_COMMUNITY)
Admission: RE | Admit: 2023-12-12 | Discharge: 2023-12-12 | Disposition: A | Discharge status: Home / Self Care | Source: Ambulatory Visit | Attending: Physician Assistant | Admitting: Physician Assistant

## 2023-12-12 ENCOUNTER — Ambulatory Visit (INDEPENDENT_AMBULATORY_CARE_PROVIDER_SITE_OTHER): Admitting: Physician Assistant

## 2023-12-12 ENCOUNTER — Encounter: Payer: Self-pay | Admitting: Physician Assistant

## 2023-12-12 VITALS — BP 103/72 | HR 94 | Ht 64.25 in | Wt 98.6 lb

## 2023-12-12 DIAGNOSIS — N760 Acute vaginitis: Secondary | ICD-10-CM | POA: Insufficient documentation

## 2023-12-12 DIAGNOSIS — Z7251 High risk heterosexual behavior: Secondary | ICD-10-CM

## 2023-12-12 DIAGNOSIS — B9689 Other specified bacterial agents as the cause of diseases classified elsewhere: Secondary | ICD-10-CM

## 2023-12-12 DIAGNOSIS — R630 Anorexia: Secondary | ICD-10-CM | POA: Diagnosis not present

## 2023-12-12 DIAGNOSIS — Z113 Encounter for screening for infections with a predominantly sexual mode of transmission: Secondary | ICD-10-CM | POA: Diagnosis not present

## 2023-12-12 LAB — POCT URINE PREGNANCY: Preg Test, Ur: NEGATIVE

## 2023-12-12 NOTE — Progress Notes (Unsigned)
      Established patient visit   Patient: Elizabeth [name]   DOB: 05/04/1996   28 y.o. Female  MRN: [id number] Visit Date: 12/12/2023  Today's healthcare provider: Trenton Frock, PA-C   No chief complaint on file.  Subjective       Medications: Outpatient Medications Prior to Visit  Medication Sig   Cholecalciferol  1.25 MG (50000 UT) capsule Take 1 capsule (50,000 Units total) by mouth once a week.   sertraline  (ZOLOFT ) 50 MG tablet Take 1 tablet (50 mg total) by mouth daily.   traZODone  (DESYREL ) 50 MG tablet Take 1 tablet (50 mg total) by mouth at bedtime as needed for sleep.   valACYclovir  (VALTREX ) 500 MG tablet Take 1 tablet (500 mg total) by mouth daily. Can increase to twice a day for 5 days in the event of a recurrence. (Patient not taking: Reported on 10/18/2023)   No facility-administered medications prior to visit.    Review of Systems {Insert previous labs (optional):23779} {See past labs  Heme  Chem  Endocrine  Serology  Results Review (optional):1}   Objective    BP 103/72   Pulse 94   Ht 5' 4.25" (1.632 m)   Wt 98 lb 9.6 oz (44.7 kg)   LMP 11/24/2023 (Exact Date)   BMI 16.79 kg/m  {Insert last BP/Wt (optional):23777}{See vitals history (optional):1}  Physical Exam Vitals reviewed.  Constitutional:      Appearance: She is not ill-appearing.  HENT:     Head: Normocephalic.  Eyes:     Conjunctiva/sclera: Conjunctivae normal.  Cardiovascular:     Rate and Rhythm: Normal rate.  Pulmonary:     Effort: Pulmonary effort is normal. No respiratory distress.  Neurological:     Mental Status: She is alert and oriented to person, place, and time.  Psychiatric:        Mood and Affect: Mood normal.        Behavior: Behavior normal.     ***  No results found for any visits on 12/12/23.  Assessment & Plan    There are no diagnoses linked to this encounter.  ***  No follow-ups on file.       Trenton Frock, PA-C  [facility] Primary  Care at [facility] [phone] (phone) [phone] (fax)  [facility]

## 2023-12-13 LAB — CBC WITH DIFFERENTIAL/PLATELET
Basophils Absolute: 0 10*3/uL (ref 0.0–0.1)
Basophils Relative: 0.8 % (ref 0.0–3.0)
Eosinophils Absolute: 0.1 10*3/uL (ref 0.0–0.7)
Eosinophils Relative: 1.4 % (ref 0.0–5.0)
HCT: 41.8 % (ref 36.0–46.0)
Hemoglobin: 13.8 g/dL (ref 12.0–15.0)
Lymphocytes Relative: 36.9 % (ref 12.0–46.0)
Lymphs Abs: 1.3 10*3/uL (ref 0.7–4.0)
MCHC: 33 g/dL (ref 30.0–36.0)
MCV: 90.9 fl (ref 78.0–100.0)
Monocytes Absolute: 0.4 10*3/uL (ref 0.1–1.0)
Monocytes Relative: 10.5 % (ref 3.0–12.0)
Neutro Abs: 1.7 10*3/uL (ref 1.4–7.7)
Neutrophils Relative %: 50.4 % (ref 43.0–77.0)
Platelets: 130 10*3/uL — ABNORMAL LOW (ref 150.0–400.0)
RBC: 4.6 Mil/uL (ref 3.87–5.11)
RDW: 13.2 % (ref 11.5–15.5)
WBC: 3.5 10*3/uL — ABNORMAL LOW (ref 4.0–10.5)

## 2023-12-13 LAB — COMPREHENSIVE METABOLIC PANEL WITH GFR
ALT: 35 U/L (ref 0–35)
AST: 52 U/L — ABNORMAL HIGH (ref 0–37)
Albumin: 4.4 g/dL (ref 3.5–5.2)
Alkaline Phosphatase: 83 U/L (ref 39–117)
BUN: 6 mg/dL (ref 6–23)
CO2: 28 meq/L (ref 19–32)
Calcium: 9.3 mg/dL (ref 8.4–10.5)
Chloride: 104 meq/L (ref 96–112)
Creatinine, Ser: 0.67 mg/dL (ref 0.40–1.20)
GFR: 118.49 mL/min (ref >60.00)
Glucose, Bld: 94 mg/dL (ref 70–99)
Potassium: 3.9 meq/L (ref 3.5–5.1)
Sodium: 141 meq/L (ref 135–145)
Total Bilirubin: 0.4 mg/dL (ref 0.2–1.2)
Total Protein: 7.4 g/dL (ref 6.0–8.3)

## 2023-12-13 LAB — HIV ANTIBODY (ROUTINE TESTING W REFLEX): HIV 1&2 Ab, 4th Generation: NONREACTIVE

## 2023-12-13 LAB — RPR: RPR Ser Ql: NONREACTIVE

## 2023-12-13 LAB — HEPATITIS C ANTIBODY: Hepatitis C Ab: NONREACTIVE

## 2023-12-14 ENCOUNTER — Ambulatory Visit: Payer: Self-pay | Admitting: Physician Assistant

## 2023-12-14 ENCOUNTER — Other Ambulatory Visit (HOSPITAL_BASED_OUTPATIENT_CLINIC_OR_DEPARTMENT_OTHER): Payer: Self-pay

## 2023-12-14 ENCOUNTER — Encounter: Payer: Self-pay | Admitting: Physician Assistant

## 2023-12-14 DIAGNOSIS — B9689 Other specified bacterial agents as the cause of diseases classified elsewhere: Secondary | ICD-10-CM

## 2023-12-14 DIAGNOSIS — B3731 Acute candidiasis of vulva and vagina: Secondary | ICD-10-CM

## 2023-12-14 LAB — CERVICOVAGINAL ANCILLARY ONLY
Bacterial Vaginitis (gardnerella): POSITIVE — AB
Candida Glabrata: NEGATIVE
Candida Vaginitis: POSITIVE — AB
Chlamydia: NEGATIVE
Comment: NEGATIVE
Comment: NEGATIVE
Comment: NEGATIVE
Comment: NEGATIVE
Comment: NEGATIVE
Comment: NORMAL
Neisseria Gonorrhea: NEGATIVE
Trichomonas: NEGATIVE

## 2023-12-14 MED ORDER — FLUCONAZOLE 150 MG PO TABS
ORAL_TABLET | ORAL | 0 refills | Status: DC
  Filled 2023-12-14: qty 3, 9d supply, fill #0

## 2023-12-14 MED ORDER — METRONIDAZOLE 0.75 % VA GEL
1.0000 | Freq: Every day | VAGINAL | 0 refills | Status: AC
Start: 2023-12-14 — End: 2023-12-23
  Filled 2023-12-14: qty 70, 5d supply, fill #0

## 2023-12-20 ENCOUNTER — Ambulatory Visit: Payer: Self-pay

## 2023-12-20 NOTE — Telephone Encounter (Signed)
 FYI Only or Action Required?: Action required by provider  Patient was last seen in primary care on  Called Nurse Triage reporting Vaginal Bleeding. Symptoms began today. Interventions attempted: Nothing. Symptoms are: stable.  Triage Disposition: No disposition on file.- See note below   Patient/caregiver understands and will follow disposition?: - Yes     Copied from CRM [id number]. Topic: Clinical - Red Word Triage >> Dec 20, 2023  4:19 PM Albertha Alosa wrote: [facility] that prompted transfer to Nurse Triage: Patient called in stating she is having side effects in her medicine for the bv, stated she stated bleeding , chunky blood but not blood clot form she stated Answer Assessment - Initial Assessment Questions 1. AMOUNT: "Describe the bleeding that you are having."  ------------ Just a one time, noted while wiping. Some spotting in the toilet ---------- Red/burgandy "shedding"      - SPOTTING: spotting, or pinkish / brownish mucous discharge; does not fill panty liner or pad    - MILD:  less than 1 pad / hour; less than patient's usual menstrual bleeding   - MODERATE: 1-2 pads / hour; 1 menstrual cup every 6 hours; small-medium blood clots (e.g., pea, grape, small coin)   - SEVERE: soaking 2 or more pads/hour for 2 or more hours; 1 menstrual cup every 2 hours; bleeding not contained by pads or continuous red blood from vagina; large blood clots (e.g., golf ball, large coin)      ------- Mild    2. ONSET: "When did the bleeding begin?" "Is it continuing now?"     --- Today    3. MENSTRUAL PERIOD: "When was the last normal menstrual period?" "How is this different than your period?"     ------------Denies    4. REGULARITY: "How regular are your periods?"     --------- Denies    5. ABDOMEN PAIN: "Do you have any pain?" "How bad is the pain?"  (e.g., Scale 1-10; mild, moderate, or severe)   - MILD (1-3): doesn't interfere with normal activities, abdomen soft and not tender to touch     - MODERATE (4-7): interferes with normal activities or awakens from sleep, abdomen tender to touch    - SEVERE (8-10): excruciating pain, doubled over, unable to do any normal activities     --- No cramping.   6. PREGNANCY: "Is there any chance you are pregnant?" "When was your last menstrual period?"    ------------- Denies  . LMP: 11/24/23   7. BREASTFEEDING: "Are you breastfeeding?"     ------------   8. HORMONE MEDICINES: "Are you taking any hormone medicines, prescription or over-the-counter?" (e.g., birth control pills, estrogen)    -------------- Denies  9. BLOOD THINNER MEDICINES: "Do you take any blood thinners?" (e.g., Coumadin / warfarin, Pradaxa / dabigatran, aspirin)     ------------ Denies   10. CAUSE: "What do you think is causing the bleeding?" (e.g., recent gyn surgery, recent gyn procedure; known bleeding disorder, cervical cancer, polycystic ovarian disease, fibroids)         ---------------------Denies   11. HEMODYNAMIC STATUS: "Are you weak or feeling lightheaded?" If Yes, ask: "Can you stand and walk normally?"        ------------- Denies    12. OTHER SYMPTOMS: "What other symptoms are you having with the bleeding?" (e.g., passed tissue, vaginal discharge, fever, menstrual-type cramps)        -----Denies    Additional Information:  -Was recently diagnosed with BV/Yeast infection. Was prescribed Metrogel  and Diflucan   -  Patient inquiring if she should stop the medication. Requesting a call back or mychart message.  Protocols used: Vaginal Bleeding - Abnormal-A-AH

## 2023-12-27 ENCOUNTER — Ambulatory Visit: Admitting: Physician Assistant

## 2023-12-27 ENCOUNTER — Ambulatory Visit (INDEPENDENT_AMBULATORY_CARE_PROVIDER_SITE_OTHER): Admitting: Family Medicine

## 2023-12-27 ENCOUNTER — Ambulatory Visit: Payer: Self-pay

## 2023-12-27 ENCOUNTER — Encounter: Payer: Self-pay | Admitting: Family Medicine

## 2023-12-27 ENCOUNTER — Other Ambulatory Visit (HOSPITAL_COMMUNITY)
Admission: RE | Admit: 2023-12-27 | Discharge: 2023-12-27 | Disposition: A | Discharge status: Home / Self Care | Source: Ambulatory Visit | Attending: Family Medicine | Admitting: Family Medicine

## 2023-12-27 VITALS — BP 123/84 | HR 84 | Ht 64.25 in | Wt 98.0 lb

## 2023-12-27 DIAGNOSIS — N898 Other specified noninflammatory disorders of vagina: Secondary | ICD-10-CM | POA: Diagnosis not present

## 2023-12-27 DIAGNOSIS — Z419 Encounter for procedure for purposes other than remedying health state, unspecified: Secondary | ICD-10-CM | POA: Diagnosis not present

## 2023-12-27 LAB — POCT URINE PREGNANCY: Preg Test, Ur: NEGATIVE
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
## 2023-12-27 NOTE — Telephone Encounter (Signed)
 FYI Only or Action Required?: FYI only for provider  Patient was last seen in primary care on 12/12/23. Called Nurse Triage reporting Vaginal Bleeding. Symptoms began a week ago. Interventions attempted: Nothing. Symptoms are: brown vaginal discharge, lower abdominal cramps gradually worsening.  Triage Disposition: See HCP Within 4 Hours (Or PCP Triage)  Patient/caregiver understands and will follow disposition?: Yes                  Copied from CRM [id number]. Topic: Clinical - Red Word Triage >> Dec 27, 2023 10:59 AM [place] A wrote: [facility] that prompted transfer to Nurse Triage: Patient is having abnormal bleeding and possible misscariage   ----------------------------------------------------------------------- From previous Reason for Contact - Scheduling: Patient/patient representative is calling to schedule an appointment. Refer to attachments for appointment information. Reason for Disposition  [1] Constant abdominal pain AND [2] present > 2 hours  Answer Assessment - Initial Assessment Questions 1. AMOUNT: Describe the bleeding that you are having.    - SPOTTING: spotting, or pinkish / brownish mucous discharge; does not fill panty liner or pad    - MILD:  less than 1 pad / hour; less than patient's usual menstrual bleeding   - MODERATE: 1-2 pads / hour; 1 menstrual cup every 6 hours; small-medium blood clots (e.g., pea, grape, small coin)   - SEVERE: soaking 2 or more pads/hour for 2 or more hours; 1 menstrual cup every 2 hours; bleeding not contained by pads or continuous red blood from vagina; large blood clots (e.g., golf ball, large coin)      She states on the 7th and 8th it was severe, but she states it has improved to more like spotting.  2. ONSET: When did the bleeding begin? Is it continuing now?     X 1 week.  3. MENSTRUAL PERIOD: When was the last normal menstrual period? How is this different than your period?     Last normal period:  11/24/23. She states it is thick, brown discharge.  4. REGULARITY: How regular are your periods?     She states for the past 2 months before this she was on Depo shots and her periods were irregular.  5. ABDOMEN PAIN: Do you have any pain? How bad is the pain?  (e.g., Scale 1-10; mild, moderate, or severe)   - MILD (1-3): doesn't interfere with normal activities, abdomen soft and not tender to touch    - MODERATE (4-7): interferes with normal activities or awakens from sleep, abdomen tender to touch    - SEVERE (8-10): excruciating pain, doubled over, unable to do any normal activities      8/10 cramps. Constant since last night.  6. PREGNANCY: Is there any chance you are pregnant? When was your last menstrual period?     Unsure, she had not taken any home pregnancy tests.  7. BREASTFEEDING: Are you breastfeeding?     No.  8. HORMONE MEDICINES: Are you taking any hormone medicines, prescription or over-the-counter? (e.g., birth control pills, estrogen)     Depo shot last administered 03/2023.  9. BLOOD THINNER MEDICINES: Do you take any blood thinners? (e.g., Coumadin / warfarin, Pradaxa / dabigatran, aspirin)     No.  10. CAUSE: What do you think is causing the bleeding? (e.g., recent gyn surgery, recent gyn procedure; known bleeding disorder, cervical cancer, polycystic ovarian disease, fibroids)         She thinks it may be a possible miscarriage.  11. HEMODYNAMIC STATUS: Are you weak  or feeling lightheaded? If Yes, ask: Can you stand and walk normally?        No weakness or lightheaded.  12. OTHER SYMPTOMS: What other symptoms are you having with the bleeding? (e.g., passed tissue, vaginal discharge, fever, menstrual-type cramps)       Menstrual cramps 8/10. She is unsure if it is tissue that is passing now.  Patient denies any fever.  Protocols used: Vaginal Bleeding - Abnormal-A-AH

## 2023-12-27 NOTE — Progress Notes (Deleted)
      Established patient visit   Patient: Elizabeth [name]   DOB: 1996-01-11   28 y.o. Female  MRN: [id number] Visit Date: 12/27/2023  Today's healthcare provider: Trenton Frock, PA-C   No chief complaint on file.  Subjective     ***  Medications: Outpatient Medications Prior to Visit  Medication Sig   Cholecalciferol  1.25 MG (50000 UT) capsule Take 1 capsule (50,000 Units total) by mouth once a week.   fluconazole  (DIFLUCAN ) 150 MG tablet Take by mouth for three doses, each 72 hours apart.   sertraline  (ZOLOFT ) 50 MG tablet Take 1 tablet (50 mg total) by mouth daily.   traZODone  (DESYREL ) 50 MG tablet Take 1 tablet (50 mg total) by mouth at bedtime as needed for sleep.   valACYclovir  (VALTREX ) 500 MG tablet Take 1 tablet (500 mg total) by mouth daily. Can increase to twice a day for 5 days in the event of a recurrence. (Patient not taking: Reported on 10/18/2023)   No facility-administered medications prior to visit.    Review of Systems {Insert previous labs (optional):23779} {See past labs  Heme  Chem  Endocrine  Serology  Results Review (optional):1}   Objective    LMP 11/24/2023 (Exact Date)  {Insert last BP/Wt (optional):23777}{See vitals history (optional):1}  Physical Exam  ***  No results found for any visits on 12/27/23.  Assessment & Plan    There are no diagnoses linked to this encounter.  ***  No follow-ups on file.       Trenton Frock, PA-C  [facility] [phone] (phone) [phone] (fax)  [facility]

## 2023-12-28 ENCOUNTER — Ambulatory Visit: Payer: Self-pay | Admitting: Family Medicine

## 2023-12-28 LAB — HIV ANTIBODY (ROUTINE TESTING W REFLEX): HIV 1&2 Ab, 4th Generation: NONREACTIVE

## 2023-12-28 LAB — HEPATITIS C ANTIBODY: Hepatitis C Ab: NONREACTIVE

## 2023-12-28 LAB — HCG, QUANTITATIVE, PREGNANCY: HCG, Total, QN: <5 m[IU]/mL

## 2023-12-28 LAB — RPR: RPR Ser Ql: NONREACTIVE

## 2023-12-31 LAB — CERVICOVAGINAL ANCILLARY ONLY
Bacterial Vaginitis (gardnerella): NEGATIVE
Candida Glabrata: NEGATIVE
Candida Vaginitis: NEGATIVE
Chlamydia: NEGATIVE
Comment: NEGATIVE
Comment: NEGATIVE
Comment: NEGATIVE
Comment: NEGATIVE
Comment: NEGATIVE
Comment: NORMAL
Neisseria Gonorrhea: NEGATIVE
Trichomonas: NEGATIVE

## 2024-01-18 DIAGNOSIS — S30810A Abrasion of lower back and pelvis, initial encounter: Secondary | ICD-10-CM | POA: Diagnosis not present

## 2024-01-18 DIAGNOSIS — S20219A Contusion of unspecified front wall of thorax, initial encounter: Secondary | ICD-10-CM | POA: Diagnosis not present

## 2024-01-18 DIAGNOSIS — S40819A Abrasion of unspecified upper arm, initial encounter: Secondary | ICD-10-CM | POA: Diagnosis not present

## 2024-01-18 DIAGNOSIS — S1091XA Abrasion of unspecified part of neck, initial encounter: Secondary | ICD-10-CM | POA: Diagnosis not present

## 2024-01-18 DIAGNOSIS — S299XXA Unspecified injury of thorax, initial encounter: Secondary | ICD-10-CM | POA: Diagnosis not present

## 2024-01-18 DIAGNOSIS — T1490XA Injury, unspecified, initial encounter: Secondary | ICD-10-CM | POA: Diagnosis not present

## 2024-01-18 DIAGNOSIS — S40811A Abrasion of right upper arm, initial encounter: Secondary | ICD-10-CM | POA: Diagnosis not present

## 2024-01-18 DIAGNOSIS — R519 Headache, unspecified: Secondary | ICD-10-CM | POA: Diagnosis not present

## 2024-01-18 DIAGNOSIS — R609 Edema, unspecified: Secondary | ICD-10-CM | POA: Diagnosis not present

## 2024-01-18 DIAGNOSIS — S80212A Abrasion, left knee, initial encounter: Secondary | ICD-10-CM | POA: Diagnosis not present

## 2024-01-18 DIAGNOSIS — N83202 Unspecified ovarian cyst, left side: Secondary | ICD-10-CM | POA: Diagnosis not present

## 2024-01-18 DIAGNOSIS — S80211A Abrasion, right knee, initial encounter: Secondary | ICD-10-CM | POA: Diagnosis not present

## 2024-01-18 DIAGNOSIS — S02652A Fracture of angle of left mandible, initial encounter for closed fracture: Secondary | ICD-10-CM | POA: Diagnosis not present

## 2024-01-18 DIAGNOSIS — R58 Hemorrhage, not elsewhere classified: Secondary | ICD-10-CM | POA: Diagnosis not present

## 2024-01-18 DIAGNOSIS — S3991XA Unspecified injury of abdomen, initial encounter: Secondary | ICD-10-CM | POA: Diagnosis not present

## 2024-01-18 DIAGNOSIS — S0081XA Abrasion of other part of head, initial encounter: Secondary | ICD-10-CM | POA: Diagnosis not present

## 2024-01-18 DIAGNOSIS — S40812A Abrasion of left upper arm, initial encounter: Secondary | ICD-10-CM | POA: Diagnosis not present

## 2024-01-18 DIAGNOSIS — S3993XA Unspecified injury of pelvis, initial encounter: Secondary | ICD-10-CM | POA: Diagnosis not present

## 2024-01-18 DIAGNOSIS — E876 Hypokalemia: Secondary | ICD-10-CM | POA: Diagnosis not present

## 2024-01-18 DIAGNOSIS — S199XXA Unspecified injury of neck, initial encounter: Secondary | ICD-10-CM | POA: Diagnosis not present

## 2024-01-18 DIAGNOSIS — Z789 Other specified health status: Secondary | ICD-10-CM | POA: Diagnosis not present

## 2024-01-18 DIAGNOSIS — S0083XA Contusion of other part of head, initial encounter: Secondary | ICD-10-CM | POA: Diagnosis not present

## 2024-01-21 ENCOUNTER — Encounter: Payer: Self-pay | Admitting: Family Medicine

## 2024-01-21 ENCOUNTER — Ambulatory Visit (INDEPENDENT_AMBULATORY_CARE_PROVIDER_SITE_OTHER): Admitting: Family Medicine

## 2024-01-21 VITALS — BP 125/87 | HR 79 | Ht 64.25 in | Wt 105.0 lb

## 2024-01-21 DIAGNOSIS — S02652A Fracture of angle of left mandible, initial encounter for closed fracture: Secondary | ICD-10-CM

## 2024-01-21 DIAGNOSIS — E876 Hypokalemia: Secondary | ICD-10-CM | POA: Diagnosis not present

## 2024-01-21 MED ORDER — CHLORHEXIDINE GLUCONATE 0.12 % MT SOLN: 15.0000 mL | Freq: Two times a day (BID) | OROMUCOSAL | 0 refills | Status: AC

## 2024-01-21 MED ORDER — OXYCODONE HCL 5 MG PO TABS: 5.0000 mg | ORAL_TABLET | Freq: Four times a day (QID) | ORAL | 0 refills | Status: AC | PRN

## 2024-01-21 MED ORDER — MUPIROCIN 2 % EX OINT: 1.0000 | TOPICAL_OINTMENT | Freq: Two times a day (BID) | CUTANEOUS | 1 refills | Status: AC
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
## 2024-01-22 ENCOUNTER — Ambulatory Visit: Payer: Self-pay | Admitting: Family Medicine

## 2024-01-22 LAB — BASIC METABOLIC PANEL WITH GFR
BUN: 10 mg/dL (ref 6–23)
CO2: 26 meq/L (ref 19–32)
Calcium: 9 mg/dL (ref 8.4–10.5)
Chloride: 103 meq/L (ref 96–112)
Creatinine, Ser: 0.6 mg/dL (ref 0.40–1.20)
GFR: 121.59 mL/min (ref >60.00)
Glucose, Bld: 83 mg/dL (ref 70–99)
Potassium: 4.3 meq/L (ref 3.5–5.1)
Sodium: 137 meq/L (ref 135–145)

## 2024-01-23 DIAGNOSIS — Z7689 Persons encountering health services in other specified circumstances: Secondary | ICD-10-CM | POA: Diagnosis not present

## 2024-01-24 DIAGNOSIS — S02652A Fracture of angle of left mandible, initial encounter for closed fracture: Secondary | ICD-10-CM | POA: Diagnosis not present

## 2024-01-24 DIAGNOSIS — Z7689 Persons encountering health services in other specified circumstances: Secondary | ICD-10-CM | POA: Diagnosis not present

## 2024-01-26 DIAGNOSIS — Z419 Encounter for procedure for purposes other than remedying health state, unspecified: Secondary | ICD-10-CM | POA: Diagnosis not present

## 2024-01-28 DIAGNOSIS — Z7689 Persons encountering health services in other specified circumstances: Secondary | ICD-10-CM | POA: Diagnosis not present

## 2024-01-28 DIAGNOSIS — S02652A Fracture of angle of left mandible, initial encounter for closed fracture: Secondary | ICD-10-CM | POA: Diagnosis not present

## 2024-01-29 ENCOUNTER — Telehealth: Payer: Self-pay | Admitting: Physician Assistant

## 2024-01-29 ENCOUNTER — Telehealth: Payer: Self-pay | Admitting: *Deleted

## 2024-01-29 NOTE — Telephone Encounter (Signed)
Pt advised to call surgeon

## 2024-01-29 NOTE — Telephone Encounter (Unsigned)
 Copied from CRM [id number]. Topic: Clinical - Medication Refill >> Jan 29, 2024  9:05 AM [place] wrote: Medication: oxyCODONE  (OXY IR/ROXICODONE ) 5 MG immediate release tablet   Has the patient contacted their pharmacy? Yes (Agent: If no, request that the patient contact the pharmacy for the refill. If patient does not wish to contact the pharmacy document the reason why and proceed with request.) (Agent: If yes, when and what did the pharmacy advise?)  This is the patient's preferred pharmacy:  [id number] - [place], [place] - [address] [place] [address] Phone: [phone] Fax: [phone]  Is this the correct pharmacy for this prescription? Yes If no, delete pharmacy and type the correct one.   Has the prescription been filled recently? Yes  Is the patient out of the medication? Yes  Has the patient been seen for an appointment in the last year OR does the patient have an upcoming appointment? Yes  Can we respond through MyChart? Yes  Agent: Please be advised that Rx refills may take up to 3 business days. We ask that you follow-up with your pharmacy.

## 2024-01-29 NOTE — Telephone Encounter (Signed)
 There is a refill request CRM in her chart.  Waddell gave her 5 days of the oxy on 01/21/24.     Copied from CRM [id number]. Topic: Clinical - Medication Question >> Jan 29, 2024 12:36 PM Elizabeth [name] wrote: Reason for CRM: Pt is calling to check the status of her medication refill request for the  oxyCODONE  (OXY IR/ROXICODONE ) 5 MG. Pt stated that he urgently needs the medication due to pain and is requesting that the medication be approved today. Pt would like a callback with an update.

## 2024-01-30 DIAGNOSIS — Z7689 Persons encountering health services in other specified circumstances: Secondary | ICD-10-CM | POA: Diagnosis not present

## 2024-02-01 DIAGNOSIS — Z7689 Persons encountering health services in other specified circumstances: Secondary | ICD-10-CM | POA: Diagnosis not present

## 2024-02-05 DIAGNOSIS — Z7689 Persons encountering health services in other specified circumstances: Secondary | ICD-10-CM | POA: Diagnosis not present

## 2024-02-13 DIAGNOSIS — Z7689 Persons encountering health services in other specified circumstances: Secondary | ICD-10-CM | POA: Diagnosis not present

## 2024-02-13 DIAGNOSIS — S02652D Fracture of angle of left mandible, subsequent encounter for fracture with routine healing: Secondary | ICD-10-CM | POA: Diagnosis not present

## 2024-02-18 ENCOUNTER — Ambulatory Visit: Payer: Self-pay

## 2024-02-18 NOTE — Telephone Encounter (Signed)
 FYI Only or Action Required?: FYI only for provider.  Patient was last seen in primary care on 01/21/2024 by Almarie Waddell NOVAK, NP.  Called Nurse Triage reporting No chief complaint on file..  Symptoms began several days ago.  Interventions attempted: Nothing.  Symptoms are: unchanged.  Triage Disposition: See PCP When Office is Open (Within 3 Days)  Patient/caregiver understands and will follow disposition?: Yes  Reason for Disposition  Patient is worried they have a sexually transmitted infection (STI)  Answer Assessment - Initial Assessment Questions 1. SYMPTOM: What's the main symptom you're concerned about? (e.g., pain, itching, dryness)     Would like STI testing, vaginitis, dysuria (burning)  3. ONSET:      5 days ago  5. ITCHING: Is there any itching? If Yes, ask: How bad is it? (Scale: 1-10; mild, moderate, severe)     Yes  6. CAUSE: What do you think is causing the discharge? Have you had the same problem before? What happened then?     Unknown  7. OTHER SYMPTOMS: Do you have any other symptoms? (e.g., fever, itching, vaginal bleeding, pain with urination, injury to genital area, vaginal foreign body)     Clear discharge  8. PREGNANCY: Is there any chance you are pregnant? When was your last menstrual period?     Currently menstruating  Protocols used: Vaginal Symptoms-A-AH

## 2024-02-20 DIAGNOSIS — Z7689 Persons encountering health services in other specified circumstances: Secondary | ICD-10-CM | POA: Diagnosis not present

## 2024-02-21 ENCOUNTER — Telehealth: Payer: Self-pay

## 2024-02-21 ENCOUNTER — Ambulatory Visit: Admitting: Medical

## 2024-02-21 NOTE — Telephone Encounter (Signed)
 noted   Copied from CRM #[id number]. Topic: Appointments - Appointment Cancel/Reschedule >> Feb 21, 2024  9:10 AM Laymon HERO wrote: Patient/patient representative is calling to cancel or reschedule an appointment. Refer to attachments for appointment information.   Patient unable to make it to todays appointment due to transportation

## 2024-02-22 ENCOUNTER — Ambulatory Visit: Admitting: Family Medicine

## 2024-02-26 ENCOUNTER — Ambulatory Visit: Admitting: Physician Assistant

## 2024-02-26 DIAGNOSIS — Z419 Encounter for procedure for purposes other than remedying health state, unspecified: Secondary | ICD-10-CM | POA: Diagnosis not present

## 2024-02-27 ENCOUNTER — Ambulatory Visit: Admitting: Physician Assistant

## 2024-02-28 DIAGNOSIS — S02652D Fracture of angle of left mandible, subsequent encounter for fracture with routine healing: Secondary | ICD-10-CM | POA: Diagnosis not present

## 2024-02-28 DIAGNOSIS — Z7689 Persons encountering health services in other specified circumstances: Secondary | ICD-10-CM | POA: Diagnosis not present

## 2024-03-28 DIAGNOSIS — Z419 Encounter for procedure for purposes other than remedying health state, unspecified: Secondary | ICD-10-CM | POA: Diagnosis not present

## 2024-04-30 DIAGNOSIS — S02652D Fracture of angle of left mandible, subsequent encounter for fracture with routine healing: Secondary | ICD-10-CM | POA: Diagnosis not present

## 2024-04-30 DIAGNOSIS — Z7689 Persons encountering health services in other specified circumstances: Secondary | ICD-10-CM | POA: Diagnosis not present

## 2024-05-11 DIAGNOSIS — N898 Other specified noninflammatory disorders of vagina: Secondary | ICD-10-CM | POA: Diagnosis not present

## 2024-05-11 DIAGNOSIS — Z3202 Encounter for pregnancy test, result negative: Secondary | ICD-10-CM | POA: Diagnosis not present

## 2024-05-11 DIAGNOSIS — N760 Acute vaginitis: Secondary | ICD-10-CM | POA: Diagnosis not present

## 2024-05-11 DIAGNOSIS — N949 Unspecified condition associated with female genital organs and menstrual cycle: Secondary | ICD-10-CM | POA: Diagnosis not present

## 2024-05-11 DIAGNOSIS — Z113 Encounter for screening for infections with a predominantly sexual mode of transmission: Secondary | ICD-10-CM | POA: Diagnosis not present

## 2024-05-23 ENCOUNTER — Encounter
